# Patient Record
Sex: Male | Born: 1940 | Race: Black or African American | Hispanic: No | Marital: Married | Smoking: Former smoker
Health system: Southern US, Community
[De-identification: ages and names within clinical notes are randomized; demographics above are authoritative.]

## PROBLEM LIST (undated history)

## (undated) DIAGNOSIS — I10 Essential (primary) hypertension: Secondary | ICD-10-CM

## (undated) DIAGNOSIS — K219 Gastro-esophageal reflux disease without esophagitis: Secondary | ICD-10-CM

## (undated) DIAGNOSIS — I351 Nonrheumatic aortic (valve) insufficiency: Secondary | ICD-10-CM

## (undated) DIAGNOSIS — A048 Other specified bacterial intestinal infections: Secondary | ICD-10-CM

## (undated) DIAGNOSIS — Z9889 Other specified postprocedural states: Secondary | ICD-10-CM

## (undated) DIAGNOSIS — H9190 Unspecified hearing loss, unspecified ear: Secondary | ICD-10-CM

## (undated) DIAGNOSIS — I429 Cardiomyopathy, unspecified: Secondary | ICD-10-CM

## (undated) DIAGNOSIS — K5790 Diverticulosis of intestine, part unspecified, without perforation or abscess without bleeding: Secondary | ICD-10-CM

## (undated) DIAGNOSIS — R1310 Dysphagia, unspecified: Secondary | ICD-10-CM

## (undated) DIAGNOSIS — K648 Other hemorrhoids: Secondary | ICD-10-CM

## (undated) DIAGNOSIS — K222 Esophageal obstruction: Secondary | ICD-10-CM

## (undated) DIAGNOSIS — K449 Diaphragmatic hernia without obstruction or gangrene: Secondary | ICD-10-CM

## (undated) DIAGNOSIS — F419 Anxiety disorder, unspecified: Secondary | ICD-10-CM

## (undated) DIAGNOSIS — J45909 Unspecified asthma, uncomplicated: Secondary | ICD-10-CM

## (undated) DIAGNOSIS — K224 Dyskinesia of esophagus: Secondary | ICD-10-CM

## (undated) DIAGNOSIS — I38 Endocarditis, valve unspecified: Secondary | ICD-10-CM

## (undated) HISTORY — DX: Other specified bacterial intestinal infections: A04.8

## (undated) HISTORY — DX: Diverticulosis of intestine, part unspecified, without perforation or abscess without bleeding: K57.90

## (undated) HISTORY — DX: Nonrheumatic aortic (valve) insufficiency: I35.1

## (undated) HISTORY — DX: Esophageal obstruction: K22.2

## (undated) HISTORY — DX: Essential (primary) hypertension: I10

## (undated) HISTORY — PX: UPPER GASTROINTESTINAL ENDOSCOPY: SHX188

## (undated) HISTORY — DX: Endocarditis, valve unspecified: I38

## (undated) HISTORY — DX: Other specified postprocedural states: Z98.890

## (undated) HISTORY — DX: Dyskinesia of esophagus: K22.4

## (undated) HISTORY — DX: Unspecified hearing loss, unspecified ear: H91.90

## (undated) HISTORY — PX: TONSILLECTOMY: SUR1361

## (undated) HISTORY — DX: Dysphagia, unspecified: R13.10

## (undated) HISTORY — DX: Cardiomyopathy, unspecified: I42.9

## (undated) HISTORY — DX: Gastro-esophageal reflux disease without esophagitis: K21.9

## (undated) HISTORY — PX: COLONOSCOPY: SHX174

## (undated) HISTORY — DX: Anxiety disorder, unspecified: F41.9

## (undated) HISTORY — DX: Other hemorrhoids: K64.8

## (undated) HISTORY — DX: Unspecified asthma, uncomplicated: J45.909

## (undated) HISTORY — DX: Diaphragmatic hernia without obstruction or gangrene: K44.9

---

## 2006-11-13 ENCOUNTER — Ambulatory Visit: Payer: Self-pay

## 2006-11-19 ENCOUNTER — Ambulatory Visit: Payer: Self-pay | Admitting: Gastroenterology

## 2006-11-20 ENCOUNTER — Ambulatory Visit: Payer: Self-pay | Admitting: Gastroenterology

## 2006-12-08 ENCOUNTER — Ambulatory Visit: Payer: Self-pay | Admitting: Gastroenterology

## 2006-12-18 ENCOUNTER — Ambulatory Visit: Payer: Self-pay | Admitting: Gastroenterology

## 2008-07-25 ENCOUNTER — Ambulatory Visit: Payer: Self-pay | Admitting: Cardiology

## 2008-08-03 ENCOUNTER — Ambulatory Visit: Payer: Self-pay

## 2008-08-03 ENCOUNTER — Encounter: Payer: Self-pay | Admitting: Cardiology

## 2009-03-23 ENCOUNTER — Ambulatory Visit: Payer: Self-pay | Admitting: Internal Medicine

## 2009-03-23 ENCOUNTER — Ambulatory Visit: Payer: Self-pay | Admitting: Cardiovascular Disease

## 2009-03-23 ENCOUNTER — Observation Stay (HOSPITAL_COMMUNITY): Admission: EM | Admit: 2009-03-23 | Discharge: 2009-03-24 | Payer: Self-pay | Admitting: Emergency Medicine

## 2009-03-24 ENCOUNTER — Encounter (INDEPENDENT_AMBULATORY_CARE_PROVIDER_SITE_OTHER): Payer: Self-pay | Admitting: *Deleted

## 2009-03-24 ENCOUNTER — Encounter: Payer: Self-pay | Admitting: Internal Medicine

## 2009-03-28 ENCOUNTER — Ambulatory Visit (HOSPITAL_COMMUNITY): Admission: RE | Admit: 2009-03-28 | Discharge: 2009-03-28 | Payer: Self-pay | Admitting: Internal Medicine

## 2009-03-28 ENCOUNTER — Encounter (INDEPENDENT_AMBULATORY_CARE_PROVIDER_SITE_OTHER): Payer: Self-pay | Admitting: *Deleted

## 2009-03-29 ENCOUNTER — Encounter: Payer: Self-pay | Admitting: Gastroenterology

## 2009-03-30 ENCOUNTER — Telehealth: Payer: Self-pay | Admitting: Internal Medicine

## 2009-04-18 ENCOUNTER — Ambulatory Visit: Payer: Self-pay | Admitting: Family Medicine

## 2009-04-18 ENCOUNTER — Telehealth: Payer: Self-pay | Admitting: Gastroenterology

## 2009-04-18 ENCOUNTER — Emergency Department (HOSPITAL_COMMUNITY): Admission: EM | Admit: 2009-04-18 | Discharge: 2009-04-18 | Payer: Self-pay | Admitting: Emergency Medicine

## 2009-04-18 ENCOUNTER — Encounter (INDEPENDENT_AMBULATORY_CARE_PROVIDER_SITE_OTHER): Payer: Self-pay | Admitting: *Deleted

## 2009-04-19 DIAGNOSIS — Z8719 Personal history of other diseases of the digestive system: Secondary | ICD-10-CM

## 2009-04-19 DIAGNOSIS — I10 Essential (primary) hypertension: Secondary | ICD-10-CM | POA: Insufficient documentation

## 2009-04-20 ENCOUNTER — Ambulatory Visit: Payer: Self-pay | Admitting: Cardiology

## 2009-04-20 DIAGNOSIS — I428 Other cardiomyopathies: Secondary | ICD-10-CM

## 2009-04-27 ENCOUNTER — Telehealth (INDEPENDENT_AMBULATORY_CARE_PROVIDER_SITE_OTHER): Payer: Self-pay | Admitting: *Deleted

## 2009-04-27 ENCOUNTER — Encounter: Payer: Self-pay | Admitting: *Deleted

## 2009-05-01 ENCOUNTER — Encounter: Payer: Self-pay | Admitting: Cardiology

## 2009-05-01 ENCOUNTER — Ambulatory Visit: Payer: Self-pay

## 2009-05-02 ENCOUNTER — Ambulatory Visit (HOSPITAL_COMMUNITY): Admission: RE | Admit: 2009-05-02 | Discharge: 2009-05-02 | Payer: Self-pay | Admitting: Gastroenterology

## 2009-05-02 ENCOUNTER — Ambulatory Visit: Payer: Self-pay | Admitting: Gastroenterology

## 2009-05-12 ENCOUNTER — Ambulatory Visit: Payer: Self-pay | Admitting: Gastroenterology

## 2009-05-26 ENCOUNTER — Encounter (INDEPENDENT_AMBULATORY_CARE_PROVIDER_SITE_OTHER): Payer: Self-pay | Admitting: *Deleted

## 2009-06-20 ENCOUNTER — Ambulatory Visit: Payer: Self-pay | Admitting: Gastroenterology

## 2009-08-08 ENCOUNTER — Encounter: Payer: Self-pay | Admitting: Cardiology

## 2009-08-09 ENCOUNTER — Ambulatory Visit: Payer: Self-pay | Admitting: Cardiology

## 2009-11-07 IMAGING — CR DG CHEST 1V PORT
1 series · 1 of 1 positions shown · non-contrast
Comparison: 03/24/2019.

CLINICAL DATA: Short of breath and chest pain.

PORTABLE CHEST - 1 VIEW

[AP]
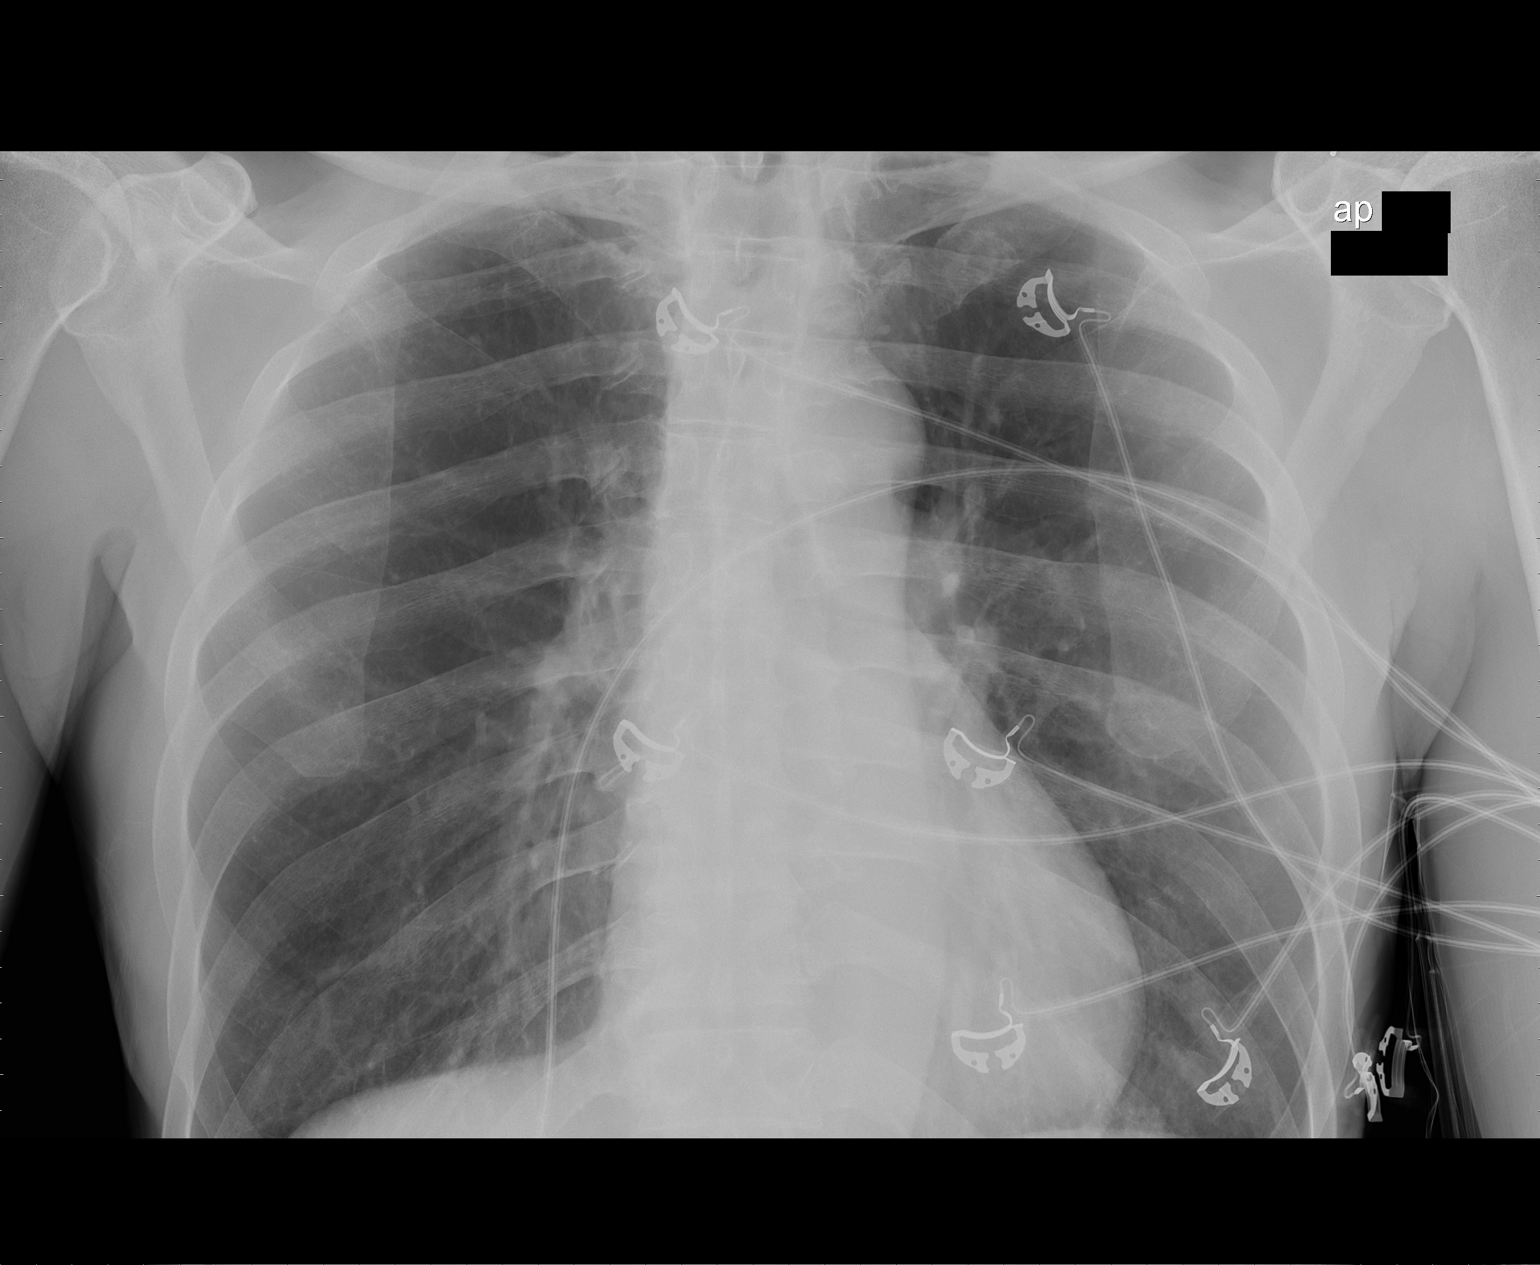

[1 of 1 positions shown; findings below may reference images not displayed]

FINDINGS: Heart size is normal and there is no heart failure.  The
lungs are clear without infiltrate or edema.  There is no pleural
effusion.  There is mild apical scarring bilaterally.
IMPRESSION: No active cardiopulmonary disease.

## 2010-01-22 ENCOUNTER — Emergency Department (HOSPITAL_COMMUNITY): Admission: EM | Admit: 2010-01-22 | Discharge: 2010-01-22 | Payer: Self-pay | Admitting: Emergency Medicine

## 2010-04-16 ENCOUNTER — Encounter: Admission: RE | Admit: 2010-04-16 | Discharge: 2010-04-16 | Payer: Self-pay | Admitting: Internal Medicine

## 2010-06-22 ENCOUNTER — Encounter (INDEPENDENT_AMBULATORY_CARE_PROVIDER_SITE_OTHER): Payer: Self-pay | Admitting: Diagnostic Neuroimaging

## 2010-06-22 ENCOUNTER — Ambulatory Visit (HOSPITAL_COMMUNITY): Admission: RE | Admit: 2010-06-22 | Discharge: 2010-06-22 | Payer: Self-pay | Admitting: Diagnostic Neuroimaging

## 2010-06-22 ENCOUNTER — Ambulatory Visit: Payer: Self-pay | Admitting: Internal Medicine

## 2010-06-22 ENCOUNTER — Ambulatory Visit: Payer: Self-pay

## 2010-07-10 ENCOUNTER — Encounter: Admission: RE | Admit: 2010-07-10 | Discharge: 2010-07-11 | Payer: Self-pay | Admitting: Diagnostic Neuroimaging

## 2010-07-16 ENCOUNTER — Telehealth: Payer: Self-pay | Admitting: Cardiology

## 2010-10-23 NOTE — Progress Notes (Signed)
Summary: echo on 9/30 -  call MD who ordered echo  Phone Note Call from Patient Call back at Home Phone [phone]   Caller: DaughterLattie Corns w [phone] Reason for Call: Talk to Nurse Details for Reason: echo on 9/30. Initial call taken by: Lorne Skeens,  July 16, 2010 1:08 PM  Follow-up for Phone Call        attempted to call the call back number left and there is no answer.  test results were faxed to the ordering MD and they should call there for results.  Sander Nephew, RN Dr Marjory Lies called back 4:42 pm - Para March gone for the day - will be in tomorrow until about 5:30 pm  Daughter aware to call other MD's office Follow-up by: Charolotte Capuchin, RN,  July 17, 2010 4:44 PM

## 2010-11-07 ENCOUNTER — Telehealth: Payer: Self-pay | Admitting: Gastroenterology

## 2010-11-14 NOTE — Progress Notes (Signed)
Summary: refill  Phone Note Call from Patient Call back at Home Phone [phone]   Caller: daughter  Call For: Dr Arlyce Dice Reason for Call: Talk to Nurse Summary of Call: Patient needs refills for DILTIAZEM until his appt 3-23 Initial call taken by: Tawni Levy,  November 07, 2010 4:52 PM  Follow-up for Phone Call        Prescription printed and faxed to pts pharmacy Follow-up by: [name] CMA Duncan Dull),  November 07, 2010 4:55 PM  Additional Follow-up for Phone Call Additional follow up Details #1::        Called pt to inform med faxed..Not correct number on file Additional Follow-up by: [name] CMA Duncan Dull),  November 07, 2010 4:56 PM    Prescriptions: DILTIAZEM 24HR ER 180MG  Take 1 tablet by mouth once a day  #30 x 1   Entered by:   [name] CMA (AAMA)   Authorized by:   Louis Meckel MD   Signed by:   [name] CMA (AAMA) on 11/07/2010   Method used:   Printed then faxed to ...       [facility] [address] [phone]* (retail)       [address]       [place], [place]  [address]       Ph: [phone] or [phone]       Fax: [phone]   RxID:   [id number]

## 2010-12-11 LAB — CBC
Hemoglobin: 11.6 g/dL — ABNORMAL LOW (ref 13.0–17.0)
MCHC: 32.9 g/dL (ref 30.0–36.0)
Platelets: 181 10*3/uL (ref 150–400)
RBC: 3.7 MIL/uL — ABNORMAL LOW (ref 4.22–5.81)
RDW: 13.6 % (ref 11.5–15.5)
WBC: 5.8 10*3/uL (ref 4.0–10.5)

## 2010-12-11 LAB — POCT I-STAT, CHEM 8
Calcium, Ion: 1.09 mmol/L — ABNORMAL LOW (ref 1.12–1.32)
Chloride: 103 mEq/L (ref 96–112)
Glucose, Bld: 97 mg/dL (ref 70–99)
Sodium: 140 mEq/L (ref 135–145)
TCO2: 27 mmol/L (ref 0–100)

## 2010-12-11 LAB — DIFFERENTIAL
Basophils Relative: 0 % (ref 0–1)
Eosinophils Relative: 1 % (ref 0–5)
Lymphocytes Relative: 27 % (ref 12–46)
Monocytes Absolute: 0.5 10*3/uL (ref 0.1–1.0)
Neutro Abs: 3.6 10*3/uL (ref 1.7–7.7)

## 2010-12-14 ENCOUNTER — Encounter: Payer: Self-pay | Admitting: Gastroenterology

## 2010-12-14 ENCOUNTER — Ambulatory Visit (INDEPENDENT_AMBULATORY_CARE_PROVIDER_SITE_OTHER): Payer: BC Managed Care – PPO | Admitting: Gastroenterology

## 2010-12-14 DIAGNOSIS — K224 Dyskinesia of esophagus: Secondary | ICD-10-CM

## 2010-12-14 DIAGNOSIS — R1310 Dysphagia, unspecified: Secondary | ICD-10-CM

## 2010-12-14 NOTE — Assessment & Plan Note (Addendum)
He is symptoms -free on Protonix. Plan to continue with the same.

## 2010-12-14 NOTE — Patient Instructions (Signed)
We are renewing your medications today

## 2010-12-14 NOTE — Progress Notes (Signed)
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 He has esophageal spasm, diagnosed by  Esophageal manometry. In the past, a distal esophageal stricture was dilated.   Last endoscopy in 2010  was normal. He is generally symptom-free on maintenance Protonix 40 mg daily. He no longer has chest pain. He has occasional, mild dysphagia to solids.    Review of Systems: Pertinent positive and negative review of systems were noted in the above HPI section. All other review of systems were otherwise negative.      Current Medications, Allergies, Past Medical History, Past Surgical History, Family History and Social History were reviewed in [employer] record . Vital signs were reviewed in today's medical record Physical Exam: General: Well developed , well nourished, no acute distress Head: Normocephalic and atraumatic Eyes:  sclerae anicteric, EOMI Ears: Normal auditory acuity Mouth: No deformity or lesions Neck: Supple, no masses or thyromegaly Lungs: Clear throughout to auscultation Heart: Regular rate and rhythm; no murmurs, rubs or bruits Abdomen: Soft, non tender and non distended. No masses, hepatosplenomegaly or hernias noted. Normal Bowel sounds Rectal:deferred Musculoskeletal: Symmetrical with no gross deformities  Skin: No lesions on visible extremities Pulses:  Normal pulses noted Extremities: No clubbing, cyanosis, edema or deformities noted Neurological: Alert oriented x 4, grossly nonfocal Cervical Nodes:  No significant cervical adenopathy Inguinal Nodes: No significant inguinal adenopathy Psychological:  Alert and cooperative. Normal mood and affect

## 2010-12-14 NOTE — Assessment & Plan Note (Signed)
He has mild dysphagia, which is most likely due to an early esophageal stricture. I carefully instructed the patient to contact me if dysphagia worsens at which point I would repeat endoscopy with dilatation.

## 2010-12-30 LAB — BASIC METABOLIC PANEL
BUN: 16 mg/dL (ref 6–23)
CO2: 27 mEq/L (ref 19–32)
CO2: 30 mEq/L (ref 19–32)
Creatinine, Ser: 1.26 mg/dL (ref 0.4–1.5)
GFR calc Af Amer: >60 mL/min (ref >60)
GFR calc non Af Amer: 54 mL/min — ABNORMAL LOW (ref >60)
GFR calc non Af Amer: 57 mL/min — ABNORMAL LOW (ref >60)
Glucose, Bld: 110 mg/dL — ABNORMAL HIGH (ref 70–99)
Potassium: 3.3 mEq/L — ABNORMAL LOW (ref 3.5–5.1)
Potassium: 3.6 mEq/L (ref 3.5–5.1)
Sodium: 138 mEq/L (ref 135–145)

## 2010-12-30 LAB — DIFFERENTIAL
Basophils Relative: 0 % (ref 0–1)
Eosinophils Absolute: 0.1 10*3/uL (ref 0.0–0.7)
Eosinophils Relative: 1 % (ref 0–5)
Lymphs Abs: 2.4 10*3/uL (ref 0.7–4.0)
Monocytes Absolute: 0.6 10*3/uL (ref 0.1–1.0)
Neutrophils Relative %: 56 % (ref 43–77)

## 2010-12-30 LAB — POCT CARDIAC MARKERS
CKMB, poc: <1 ng/mL — ABNORMAL LOW (ref 1.0–8.0)
Myoglobin, poc: 124 ng/mL (ref 12–200)
Troponin i, poc: <0.05 ng/mL (ref 0.00–0.09)

## 2010-12-30 LAB — CBC
HCT: 33.4 % — ABNORMAL LOW (ref 39.0–52.0)
HCT: 35.1 % — ABNORMAL LOW (ref 39.0–52.0)
Hemoglobin: 11.4 g/dL — ABNORMAL LOW (ref 13.0–17.0)
Hemoglobin: 11.8 g/dL — ABNORMAL LOW (ref 13.0–17.0)
Hemoglobin: 12.8 g/dL — ABNORMAL LOW (ref 13.0–17.0)
MCHC: 33.6 g/dL (ref 30.0–36.0)
MCHC: 34 g/dL (ref 30.0–36.0)
MCV: 95.4 fL (ref 78.0–100.0)
MCV: 95.5 fL (ref 78.0–100.0)
Platelets: 163 10*3/uL (ref 150–400)
Platelets: 189 10*3/uL (ref 150–400)
RDW: 12.9 % (ref 11.5–15.5)

## 2010-12-30 LAB — COMPREHENSIVE METABOLIC PANEL
Albumin: 3.1 g/dL — ABNORMAL LOW (ref 3.5–5.2)
Alkaline Phosphatase: 53 U/L (ref 39–117)
BUN: 13 mg/dL (ref 6–23)
Calcium: 8.5 mg/dL (ref 8.4–10.5)
Glucose, Bld: 86 mg/dL (ref 70–99)
Potassium: 3.9 mEq/L (ref 3.5–5.1)
Sodium: 138 mEq/L (ref 135–145)
Total Protein: 6.1 g/dL (ref 6.0–8.3)

## 2010-12-30 LAB — CK TOTAL AND CKMB (NOT AT ARMC)
CK, MB: 0.9 ng/mL (ref 0.3–4.0)
Relative Index: 0.7 (ref 0.0–2.5)
Total CK: 129 U/L (ref 7–232)
Total CK: 142 U/L (ref 7–232)

## 2010-12-30 LAB — CARDIAC PANEL(CRET KIN+CKTOT+MB+TROPI)
CK, MB: 0.8 ng/mL (ref 0.3–4.0)
CK, MB: 2.3 ng/mL (ref 0.3–4.0)
Relative Index: 0.8 (ref 0.0–2.5)
Relative Index: 2 (ref 0.0–2.5)
Troponin I: <0.01 ng/mL (ref 0.00–0.06)

## 2011-01-05 ENCOUNTER — Other Ambulatory Visit: Payer: Self-pay | Admitting: Gastroenterology

## 2011-01-08 ENCOUNTER — Other Ambulatory Visit: Payer: Self-pay | Admitting: Gastroenterology

## 2011-01-08 NOTE — Telephone Encounter (Signed)
Medication Sent already from refill request from pharmacy

## 2011-02-05 NOTE — H&P (Signed)
NAME:  Bradley Ramirez, Bradley Ramirez NO.:  [id number]   MEDICAL RECORD NO.:  [id number]          PATIENT TYPE:  INP   LOCATION:  4705                         FACILITY:  [facility]   PHYSICIAN:  Verne Carrow, MDDATE OF BIRTH:  08-18-1941   DATE OF ADMISSION:  03/23/2009  DATE OF DISCHARGE:                              HISTORY & PHYSICAL   PRIMARY CARDIOLOGIST:  Rollene Rotunda, MD, [facility]   PRIMARY CARE PHYSICIAN:  Jonita Albee, MD   Mr. Ploeger is a delightful 70 year old African American gentleman,  who is deaf.  He is also married to a woman that is deaf, and they have  4 children, who all have no hearing problems.  He is here today with an  interpreter accompanied by his family.  He was in his usual state of  health yesterday and no problems.  After eating lunch, he began having  problems with some indigestion, GERD symptoms, lot of belching, later  turned in some epigastric discomfort.  He tried Tums and Mylanta without  relief.  This continued intermittently through the day, went to bed last  night, woke up around 3 a.m. because of discomfort in his chest.  Further evaluation reveals epigastric discomfort.  He is very sore and  tender to touch there now.  He states he felt like he could not swallow  very well.  He drank some water and this did not help, tried some  Mylanta again, still no relief, finally fell back asleep, symptoms  returned again this morning.  His family decided he need to get  evaluated.  He has not at any time had any heaviness, tightness, or  pressure in his chest per se, all the discomfort has been high  epigastric area.  He denies any associated symptoms.  He has been dizzy  at times, but he is associated this with a choking sensation when he was  eating dinner last night.  He says he feels like something is stuck in  his throat.   PAST MEDICAL HISTORY:  Hypertension, GERD, and hiatal hernia.  He has  been evaluated by Dr. Arlyce Dice in the past.   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  Antoine Poche.  He underwent a 2-D echocardiogram in November 2009.  The  patient found to have mild aortic valve regurgitation and trivial mitral  valve regurgitation, an EF of 45%, pulmonic valve was not well  visualized.  There was mild pulmonic regurgitation and trivial tricuspid  regurgitation.  Dr. Gala Romney who interpreted echocardiogram felt there  was at least mild PI.   SOCIAL HISTORY:  The patient lives in [place] with his wife.  He is a  retired Arboriculturist, although he continues to remain very active and well  in his own home, etc., doing work around [employer].  He quit smoking in  1998.  He quit drinking in 1987.  He has 4 adult children alive and  well.   FAMILY HISTORY:  Noncontributory for coronary artery disease.   REVIEW OF SYSTEMS:  Positive for 10-pound weight loss  in 6 months.  This  correlates with the time that Dr. Antoine Poche started the patient on HCTZ  for blood pressure, some dizziness associated with difficulty swallowing  and choking sensation and GERD symptoms, and epigastric pain.  All other  systems reviewed and negative.   ALLERGIES:  No known drug allergies.   MEDICATIONS:  1. Protonix.  2. HCTZ 12.5.  3. Aspirin 81.  4. Vitamin E.  5. Multivitamin daily.   PHYSICAL EXAMINATION:  VITAL SIGNS:  Temperature 97.5, heart rate 60,  respirations 20, blood pressure 138/80, and sat 98% on room air.  GENERAL:  In no acute distress.  HEENT:  Remarkable.  NECK:  Supple without lymphadenopathy, bruit, or JVD.  CARDIOVASCULAR:  S1 and S2 with a questionable 1/6 diastolic murmur.  LUNGS:  Clear to auscultation bilaterally.  SKIN:  Warm and dry.  ABDOMEN:  Soft and nontender.  He does have tenderness in the high  epigastric area.  However, he has positive bowel sounds.  LOWER EXTREMITIES:  Without clubbing, cyanosis, or edema.  NEUROLOGIC:  Alert and oriented x3 with  the exception of being deaf.   Chest x-ray showed no acute findings.  EKG; sinus rhythm with PVCs at a  rate of 58.  Point of care negative x1.  Hematocrit 37.2.  Potassium is  3.3, creatinine 1.3, and glucose 110.   The patient will be admitted for observation, doubt chest pain is a  cardiac.  The patient has received a GI cocktail with complete  resolution of his discomfort here in the ER.  We will admit for  observation, cycle enzymes.  I have already called Dr. Marzetta Board PA to  consult.  We will continue home meds for now.  Check TSH and hemoglobin  A1c.  Follow up outpatient with Dr. Antoine Poche.  Dr. Verne Carrow  has been into examine and assess the patient, agrees with plan of care.      Dorian Pod, ACNP      Verne Carrow, MD  Electronically Signed    MB/MEDQ  D:  03/23/2009  T:  03/24/2009  Job:  [id number]

## 2011-02-05 NOTE — Assessment & Plan Note (Signed)
[facility]                            CARDIOLOGY OFFICE NOTE   ROBEY, MASSMANN                     MRN:          [id number]  DATE:07/25/2008                            DOB:          [birth year]    REASON FOR CONSULTATION:  Evaluate the patient with murmur.   HISTORY OF PRESENT ILLNESS:  The patient is a very pleasant 70 year old  gentleman with diastolic heart murmur noted by Dr. Perrin Maltese recently.  The  patient is being followed by his primary care and also for an elevated  PSA.  He had not been noted apparently to have this heart murmur in the  past.  He was referred to [place] in February of last year for stress  perfusion study which was negative for any evidence of infarct or  ischemia.   The patient is a remarkable gentleman.  He is been deaf since age 29  secondary to ear infections.  His wife also is deaf.  They have raised 4  hearing children.  He has several grandchildren.  He works.  He comes  today with his interpreter.  He says he feels well.  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  With this level of activity, he says  he does not get any chest pressure, neck, or arm discomfort.  He does  not have any shortness of breath, PND, or orthopnea.  He does not have  any palpitations, presyncope, or syncope.   Of note, the patient has recently been found to have some elevated blood  pressures and this is being followed by Dr. Perrin Maltese.   PAST MEDICAL HISTORY:  Elevated PSA, recently elevated blood pressures.   PAST SURGICAL HISTORY:  None.   ALLERGIES:  None.   MEDICATIONS:  Pantoprazole 40 mg daily.   SOCIAL HISTORY:  The patient smoked 2 packs per day for 35 years, but  quit in 1998.  He quit drinking alcohol in 1987.  He still works part-  time, though he is retired.  He has 4 children and many grandchildren.  He is married.   FAMILY HISTORY:  Negative for early coronary artery disease, negative  for cardiomyopathies,  or other vascular disease.   REVIEW OF SYSTEMS:  As stated in the HPI, positive for rare dizziness,  dentures, occasional reflux, varicose veins.  Negative for all other  systems.   PHYSICAL EXAMINATION:  GENERAL:  The patient is pleasant and in no  distress.  VITAL SIGNS:  Blood pressure 150/88, heart rate 74 and regular, weight  170 pounds.  HEENT:  Eyelids unremarkable, pupils equal, round, and reactive to  light, fundi, not visualized, oral mucosa unremarkable.  NECK:  No jugular venous distention at 45 degrees, carotid upstroke  brisk and symmetric, no bruits, no thyromegaly.  LYMPHATICS:  No cervical, axillary, inguinal adenopathy.  LUNGS:  Clear to auscultation bilaterally.  BACK:  No costovertebral angle tenderness.  CHEST:  Unremarkable.  HEART:  PMI not displaced or sustained, S1 and S2 within normal limits,  no S3, no S4, 2/6 diastolic heart murmur, heard best at the third to  fourth intercostal space, no systolic murmurs.  ABDOMEN:  Flat, positive bowel sounds, normal in frequency and pitch, no  bruits, no rebound, no guarding, no midline pulsatile mass, no  hepatomegaly, splenomegaly.  SKIN:  No rashes, no nodules.  EXTREMITIES:  2+ pulses throughout, no edema, no cyanosis, no clubbing.  NEURO:  Oriented to person, place, and time, cranial nerves II through  XII grossly intact except for his deafness, motor grossly intact.   EKG sinus rhythm, rate 71, premature atrial contractions, no acute ST-T  wave changes.   ASSESSMENT AND PLAN:  1. Diastolic heart murmur.  The patient has a soft diastolic heart      murmur.  At this point he may have some mild aortic insufficiency      judging from the location.  I would be surprised if this is      hemodynamically significant, but it is a clear murmur and needs to      be followed.  Therefore, we will get an echocardiogram.  Further      evaluation based on the results of this.  2. Hypertension.  We discussed this.  His blood  pressures have      fluctuated little bit recently, been elevated.  I suspect he is      going to need something such as low dose hydrochlorothiazide.      Certainly, he is think, does not drink alcohol, and is active.      Therefore, therapeutic lifestyle changes are not likely to      contribute to better blood pressure control.  I asked him to watch      his blood pressure if he has the opportunity and he will follow      with Dr. Perrin Maltese in the next couple of months.  3. Followup.  I have scheduled the patient to see me in 1 year, but      will change this if I need to see him sooner based on the      echocardiogram.     Rollene Rotunda, MD, [facility]  Electronically Signed    JH/MedQ  DD: 07/25/2008  DT: 07/26/2008  Job #: [id number]   cc:   Jonita Albee, M.D.

## 2011-02-05 NOTE — Op Note (Signed)
NAME:  Bradley Ramirez, Bradley Ramirez            ACCOUNT NO.:  [id number]   MEDICAL RECORD NO.:  [id number]          PATIENT TYPE:  AMB   LOCATION:  ENDO                         FACILITY[facility]  [facility]   PHYSICIAN:  [name], MD,FACGDATE OF BIRTH:  11/02/1940   DATE OF PROCEDURE:  DATE OF DISCHARGE:  05/02/2009                               OPERATIVE REPORT   Esophageal manometry was performed in the usual pullback technique.   Upper esophageal sphincter pressure contractions and relaxation were  normal.  There was 100% peristaltic contraction.  They were of normal amplitude  and duration.  Resting LES pressure was 24.4 mmHg.  Residual pressure was 5.4 mmHg  which is normal.  Percent relaxation was 79%.   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   RECOMMENDATIONS:  Trial of antispasmodics such as calcium blocking  agents or Botox injection of the LES.      [name], MD,FACG  Electronically Signed     RDK/MEDQ  D:  05/12/2009  T:  05/13/2009  Job:  [id number]

## 2011-02-05 NOTE — Discharge Summary (Signed)
NAME:  Bradley Ramirez, Bradley Ramirez NO.:  [id number]   MEDICAL RECORD NO.:  [id number]          PATIENT TYPE:  OBV   LOCATION:  4705                         FACILITY:  [facility]   PHYSICIAN:  Rollene Rotunda, MD, FACCDATE OF BIRTH:  March 08, 1941   DATE OF ADMISSION:  03/23/2009  DATE OF DISCHARGE:  03/24/2009                               DISCHARGE SUMMARY   PRIMARY CARDIOLOGIST:  Rollene Rotunda, MD, [facility].   PRIMARY CARE PHYSICIAN:  Jonita Albee, MD.   CONSULTING PHYSICIAN:  Hedwig Morton. Juanda Chance, MD.   PROCEDURES PERFORMED DURING HOSPITALIZATION:  EGD, which was completed  on March 24, 2009, revealing normal GE junction otherwise normal  examination.  No evidence of stricture.  Status post passage of large 17-  mm Savary dilator without resistance or blood on dilator.   FINAL DISCHARGE DIAGNOSES:  1. Noncardiac chest pain.  2. Hypertension.  3. Gastroesophageal reflux disease  4. History of hiatal hernia.  5. History of deafness.  6. Diastolic murmur   HOSPITAL COURSE:  This is a 70 year old deaf African American male who  presented to the emergency room with complaints of indigestion, GERD  symptoms, a lot of belching, but later turned into epigastric  discomfort.  Tums and Mylanta did not give relief.  The patient was seen  in the emergency room secondary to recurrence of symptoms along with  heaviness and tightness in his chest and through interpreter, the  patient was explaining symptoms without evidence of diaphoresis, nausea,  vomiting, or presyncope.   The patient was seen and examined by Dr. Verne Carrow, and  Dorian Pod, nurse practitioner in the emergency room and admitted  to rule out myocardial infarction.  The patient was given a GI cocktail  with complete resolution of his discomfort in the emergency room.  The  patient's TSH and hemoglobin A1c were checked.  The patient's cardiac  enzymes were found to be negative x3.  The patient's hemoglobin A1c  was  within normal limits at 5.9.  All other labs were found to be negative.  The patient did undergo EGD on March 24, 2009, by Dr. Lina Sar with  results as discussed above.  Please see Dr. Verlee Monte Brodie's note for more  details.  It was found that the patient was placed on Protonix and will  follow up with Dr. Juanda Chance in her office for a barium swallow as an  outpatient.  The patient will also follow up with Dr. Rollene Rotunda in  the office in approximately 3 months for continued cardiac management.  The patient's medications will not be changed with the exception of  addition of Protonix.  The patient through interpreter verbalized  understanding and will be discharged today.   DISCHARGE LABORATORY DATA:  Hemoglobin 11.4, hematocrit 33.4, white  blood cells 6.7, platelets 163.  Sodium 138, potassium 3.9, chloride  107, CO2 29, BUN 13, creatinine 1.32.  LFTs within normal limits.  Cardiac enzymes negative x3, A1c 5.9.   VITAL SIGNS ON DISCHARGE:  Blood pressure 96/65, heart rate 56,  respirations 18, temperature 98.2.   DISCHARGE MEDICATIONS:  Aspirin 81 mg daily, Protonix  40 mg twice a day,  hydrochlorothiazide 12.5 mg daily, multivitamin 1 daily.   EKG on discharge revealing sinus bradycardia with a rate of 55 beats per  minute with no evidence of heart block.   FOLLOWUP PLANS AND APPOINTMENT:  1. The patient will be seen and Dr. Jenene Slicker office in approximately      1 month to 6 weeks.  2. The patient will follow with Dr. Lina Sar.  He is to call on his      own in accord to make that appointment.  Plans were to follow up      with a barium swallow.  3. Secondary to bradycardia on a patient with no evidence of TSH      elevation or use of beta-blocker should be evaluated with possible      Holter as an outpatient if he has continued symptoms to evaluate      for symptomatic bradycardia.   Time spent with the patient to include physician time 35 minutes.      Bettey Mare. Lyman Bishop, NP      Rollene Rotunda, MD, [facility]  Electronically Signed    KML/MEDQ  D:  03/24/2009  T:  03/25/2009  Job:  [id number]   cc:   [name], MD,FACG  Jonita Albee, M.D.

## 2011-02-08 NOTE — Assessment & Plan Note (Signed)
[facility]                         GASTROENTEROLOGY OFFICE NOTE   Bradley, Ramirez                     MRN:          [id number]  DATE:11/19/2006                            DOB:          [birth year]    REASON FOR CONSULTATION:  Dysphagia.   Bradley Ramirez is a 70 year old African-American male, referred through  the courtesy of Dr. Perrin Maltese for evaluation.  He has been experiencing  dysphagia to solids and occasional liquids.  He has occasional pyrosis  for which he takes Protonix.  He denies odynophagia.   PAST MEDICAL HISTORY:  Pertinent for deafness.  He is status post  tonsillectomy.   FAMILY HISTORY:  Noncontributory.   MEDICATIONS:  Baby aspirin.   He has no allergies.   He quit smoking 8 years ago.  He does not drink.  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   REVIEW OF SYSTEMS:  Positive for joint pains.   PHYSICAL EXAMINATION:  Pulse 82, blood pressure 120/70, weight 172.  HEENT: EOMI. PERRLA. Sclerae are anicteric.  Conjunctivae are pink.  NECK:  Supple without thyromegaly, adenopathy or carotid bruits.  CHEST:  Clear to auscultation and percussion without adventitious  sounds.  CARDIAC:  Regular rhythm; normal S1 S2.  There are no murmurs, gallops  or rubs.  ABDOMEN:  Bowel sounds are normoactive.  Abdomen is soft, non-tender and  non-distended.  There are no abdominal masses, tenderness, splenic  enlargement or hepatomegaly.  EXTREMITIES:  Full range of motion.  No cyanosis, clubbing or edema.  RECTAL:  Deferred.   IMPRESSION:  1. Dysphagia, rule out esophageal stricture.  2. Deafness.   RECOMMENDATION:  1. Upper endoscopy with dilatation as indicated.  2. Screening colonoscopy (to be done at some point in the future).     [name], MD,FACG  Electronically Signed    RDK/MedQ  DD: 11/19/2006  DT: 11/19/2006  Job #: [id number]   cc:   Jonita Albee, M.D.

## 2011-02-08 NOTE — Letter (Signed)
November 19, 2006    Jonita Albee, M.D.  [facility]  [address]  [place], [place] [address]   RE:  [name], [name]  MRN:  [id number]  /  DOB:  1941/08/03   Dear Dr. Perrin Maltese:   Upon your kind referral, I had the pleasure of evaluating your patient  and I am pleased to offer my findings.  I saw Bradley Ramirez in the  office today.  Enclosed is a copy of my progress note that details my  findings and recommendations.   Thank you for the opportunity to participate in your patient's care.    Sincerely,      [name], MD,FACG  Electronically Signed    RDK/MedQ  DD: 11/19/2006  DT: 11/19/2006  Job #: [id number]

## 2011-03-07 ENCOUNTER — Other Ambulatory Visit: Payer: Self-pay | Admitting: Gastroenterology

## 2011-04-01 ENCOUNTER — Encounter: Payer: Self-pay | Admitting: Cardiology

## 2011-04-24 ENCOUNTER — Encounter: Payer: Self-pay | Admitting: Cardiology

## 2011-05-08 ENCOUNTER — Other Ambulatory Visit: Payer: Self-pay | Admitting: Gastroenterology

## 2011-05-09 ENCOUNTER — Other Ambulatory Visit: Payer: Self-pay | Admitting: *Deleted

## 2011-05-09 NOTE — Telephone Encounter (Signed)
OK to prescribe 

## 2011-05-09 NOTE — Telephone Encounter (Signed)
Dr Arlyce Dice, We have received a refill request for  Diltiazem 180mg  one a day. Are we still prescribing this for the pt?

## 2011-05-24 ENCOUNTER — Ambulatory Visit (INDEPENDENT_AMBULATORY_CARE_PROVIDER_SITE_OTHER): Payer: BC Managed Care – PPO | Admitting: Cardiology

## 2011-05-24 ENCOUNTER — Encounter: Payer: Self-pay | Admitting: Cardiology

## 2011-05-24 VITALS — BP 128/72 | HR 60 | Ht 74.0 in | Wt 156.1 lb

## 2011-05-24 DIAGNOSIS — R079 Chest pain, unspecified: Secondary | ICD-10-CM

## 2011-05-24 DIAGNOSIS — J984 Other disorders of lung: Secondary | ICD-10-CM

## 2011-05-24 DIAGNOSIS — I428 Other cardiomyopathies: Secondary | ICD-10-CM

## 2011-05-24 DIAGNOSIS — I10 Essential (primary) hypertension: Secondary | ICD-10-CM

## 2011-05-24 DIAGNOSIS — I491 Atrial premature depolarization: Secondary | ICD-10-CM

## 2011-05-24 NOTE — Progress Notes (Signed)
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 However, stress perfusion study suggested a well-preserved ejection fraction and no evidence of ischemia or infarct in the past. He was noted recently to have PACs on EKG. There was a mention of chest discomfort but he does not recall this. He actually reports that he does quite well.  The patient denies any new symptoms such as chest discomfort, neck or arm discomfort. There has been no new shortness of breath, PND or orthopnea. There have been no reported palpitations, presyncope or syncope.  No Known Allergies  Current Outpatient Prescriptions  Medication Sig Dispense Refill  . aspirin 81 MG tablet Take 81 mg by mouth daily.        Marland Kitchen b complex vitamins tablet Take 1 tablet by mouth daily.        . Cholecalciferol (VITAMIN D3) 3000 UNITS TABS Take 1 tablet by mouth daily.        Marland Kitchen diltiazem (CARDIZEM CD) 180 MG 24 hr capsule TAKE 1 CAPSULE EVERY DAY  30 capsule  1  . pantoprazole (PROTONIX) 40 MG tablet Take 40 mg by mouth. Take 1 tablet by mouth 2 times a day       . hydrochlorothiazide (,MICROZIDE/HYDRODIURIL,) 12.5 MG capsule Take 12.5 mg by mouth daily.          Past Medical History  Diagnosis Date  . Hiatal hernia   . GERD (gastroesophageal reflux disease)   . Hypertension   . Esophageal spasm   . Dysphagia, unspecified   . Esophageal stricture   . Chest pain   . Murmur, diastolic   . Deafness   . S/P colonoscopy     Past Surgical History  Procedure Date  . Tonsillectomy     ROS:  As stated in the HPI and negative for all other systems.  PHYSICAL EXAM BP 128/72  Pulse 60  Ht 6\' 2"  (1.88 m)  Wt 156 lb 1.9 oz (70.816 kg)  BMI 20.04 kg/m2 GENERAL:  Well appearing HEENT:  Pupils equal round and reactive, fundi not visualized, oral mucosa unremarkable NECK:  No jugular venous distention, waveform within normal limits, carotid  upstroke brisk and symmetric, no bruits, no thyromegaly LYMPHATICS:  No cervical, inguinal adenopathy LUNGS:  Clear to auscultation bilaterally BACK:  No CVA tenderness CHEST:  Unremarkable HEART:  PMI not displaced or sustained,S1 and S2 within normal limits, no S3, no S4, no clicks, no rubs, soft upper left sternal border diastolic murmur ABD:  Flat, positive bowel sounds normal in frequency in pitch, no bruits, no rebound, no guarding, no midline pulsatile mass, no hepatomegaly, no splenomegaly EXT:  2 plus pulses throughout, no edema, no cyanosis no clubbing SKIN:  No rashes no nodules NEURO:  Cranial nerves II through XII grossly intact, motor grossly intact throughout PSYCH:  Cognitively intact, oriented to person place and time  EKG:  Sinus rhythm, rate 60, axis within normal limits, intervals within normal limits, premature atrial contractions, no acute ST-T wave changes.  ASSESSMENT AND PLAN

## 2011-05-24 NOTE — Assessment & Plan Note (Signed)
He has no symptoms related to this.  No change in therapy is indicated.

## 2011-05-24 NOTE — Assessment & Plan Note (Signed)
The blood pressure is at target. No change in medications is indicated. We will continue with therapeutic lifestyle changes (TLC).  

## 2011-05-24 NOTE — Assessment & Plan Note (Signed)
He denies this and he has had a negative stress test in the recent past.  No further testing is indicated.

## 2011-05-24 NOTE — Assessment & Plan Note (Signed)
He had a mildly reduced ejection fraction on echo last year wiith some pulmonary insufficiency. I will repeat an echocardiogram to followup on this. For now he is asymptomatic.

## 2011-05-24 NOTE — Patient Instructions (Signed)
Your physician has requested that you have an echocardiogram. Echocardiography is a painless test that uses sound waves to create images of your heart. It provides your doctor with information about the size and shape of your heart and how well your heart's chambers and valves are working. This procedure takes approximately one hour. There are no restrictions for this procedure.  The current medical regimen is effective;  continue present plan and medications.  Follow up in 1 year with Dr Hochrein.  You will receive a letter in the mail 2 months before you are due.  Please call us when you receive this letter to schedule your follow up appointment.  

## 2011-06-05 ENCOUNTER — Other Ambulatory Visit (HOSPITAL_COMMUNITY): Payer: Medicare Other | Admitting: Radiology

## 2011-06-09 ENCOUNTER — Other Ambulatory Visit: Payer: Self-pay | Admitting: Gastroenterology

## 2011-06-12 ENCOUNTER — Encounter: Payer: Self-pay | Admitting: *Deleted

## 2011-06-26 ENCOUNTER — Other Ambulatory Visit (HOSPITAL_COMMUNITY): Payer: Medicare Other | Admitting: Radiology

## 2011-06-27 ENCOUNTER — Ambulatory Visit (HOSPITAL_COMMUNITY): Payer: Medicare Other | Attending: Cardiology | Admitting: Radiology

## 2011-06-27 DIAGNOSIS — I10 Essential (primary) hypertension: Secondary | ICD-10-CM | POA: Insufficient documentation

## 2011-06-27 DIAGNOSIS — I428 Other cardiomyopathies: Secondary | ICD-10-CM | POA: Insufficient documentation

## 2011-06-27 DIAGNOSIS — J984 Other disorders of lung: Secondary | ICD-10-CM

## 2011-06-27 DIAGNOSIS — I379 Nonrheumatic pulmonary valve disorder, unspecified: Secondary | ICD-10-CM

## 2011-07-08 ENCOUNTER — Telehealth: Payer: Self-pay | Admitting: Cardiology

## 2011-07-08 NOTE — Telephone Encounter (Signed)
Spoke with pt, aware of echo results Bradley Ramirez  

## 2011-07-08 NOTE — Telephone Encounter (Signed)
Pt is calling tests results please call

## 2011-09-02 ENCOUNTER — Other Ambulatory Visit: Payer: Self-pay | Admitting: Gastroenterology

## 2011-09-02 NOTE — Telephone Encounter (Signed)
Dr Arlyce Dice, Can this patient have a refill of Diltiazem 180mg ?

## 2011-11-05 ENCOUNTER — Other Ambulatory Visit: Payer: Self-pay | Admitting: Gastroenterology

## 2011-12-06 ENCOUNTER — Other Ambulatory Visit: Payer: Self-pay | Admitting: Internal Medicine

## 2012-01-05 ENCOUNTER — Other Ambulatory Visit: Payer: Self-pay | Admitting: Physician Assistant

## 2012-01-05 ENCOUNTER — Other Ambulatory Visit: Payer: Self-pay | Admitting: Gastroenterology

## 2012-01-06 ENCOUNTER — Encounter: Payer: Self-pay | Admitting: Gastroenterology

## 2012-01-06 ENCOUNTER — Ambulatory Visit (INDEPENDENT_AMBULATORY_CARE_PROVIDER_SITE_OTHER): Payer: Medicare Other | Admitting: Gastroenterology

## 2012-01-06 VITALS — BP 116/74 | HR 64 | Ht 74.0 in | Wt 162.8 lb

## 2012-01-06 DIAGNOSIS — R112 Nausea with vomiting, unspecified: Secondary | ICD-10-CM

## 2012-01-06 NOTE — Assessment & Plan Note (Addendum)
Symptoms of postprandial nausea and vomiting suggest a partial gastric outlet obstruction or gastroparesis.  Last endoscopy in 2010 was normal.  Recommendations #1 gastric emptying scan #2 to consider changing PPI medicine pending results of #1

## 2012-01-06 NOTE — Progress Notes (Signed)
History of Present Illness:  Bradley Ramirez has returned for evaluation of nausea and vomiting. 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  His main complaint is postprandial vomiting. This may occur one to 3 hours after eating, especially in the mornings. He is without abdominal pain.  He takes Protonix daily. He complains of postprandial fullness. At this time he denies chest pain or dysphagia.    Review of Systems: Pertinent positive and negative review of systems were noted in the above HPI section. All other review of systems were otherwise negative.    Current Medications, Allergies, Past Medical History, Past Surgical History, Family History and Social History were reviewed in [employer] electronic medical record  Vital signs were reviewed in today's medical record. Physical Exam: General: Well developed , well nourished, no acute distress Head: Normocephalic and atraumatic Eyes:  sclerae anicteric, EOMI Ears: Normal auditory acuity Mouth: No deformity or lesions Lungs: Clear throughout to auscultation Heart: Regular rate and rhythm; no murmurs, rubs or bruits Abdomen: Soft, non tender and non distended. No masses, hepatosplenomegaly or hernias noted. Normal Bowel sounds. There is no succussion splash Rectal:deferred Musculoskeletal: Symmetrical with no gross deformities  Pulses:  Normal pulses noted Extremities: No clubbing, cyanosis, edema or deformities noted Neurological: Alert oriented x 4, grossly nonfocal Psychological:  Alert and cooperative. Normal mood and affect

## 2012-01-06 NOTE — Patient Instructions (Signed)
You have been scheduled for a Gastric Emptying Scan at [facility]iology on the 1st floor  Nothing to eat or drink after midnight and no stomach medications 24 hours before procedure  01/16/2012 at 10:00am to arrive at 9:45 am

## 2012-01-06 NOTE — Telephone Encounter (Signed)
Can this patient have a refill of his diltazem 180 pill

## 2012-01-16 ENCOUNTER — Ambulatory Visit (HOSPITAL_COMMUNITY)
Admission: RE | Admit: 2012-01-16 | Discharge: 2012-01-16 | Disposition: A | Discharge status: Home / Self Care | Payer: Medicare Other | Source: Ambulatory Visit | Attending: Gastroenterology | Admitting: Gastroenterology

## 2012-01-16 DIAGNOSIS — R142 Eructation: Secondary | ICD-10-CM | POA: Insufficient documentation

## 2012-01-16 DIAGNOSIS — R143 Flatulence: Secondary | ICD-10-CM | POA: Insufficient documentation

## 2012-01-16 DIAGNOSIS — R112 Nausea with vomiting, unspecified: Secondary | ICD-10-CM | POA: Insufficient documentation

## 2012-01-16 DIAGNOSIS — R141 Gas pain: Secondary | ICD-10-CM | POA: Insufficient documentation

## 2012-01-16 MED ORDER — TECHNETIUM TC 99M SULFUR COLLOID
2.2000 | Freq: Once | INTRAVENOUS | Status: AC | PRN
  Administered 2012-01-16: 2.2 via INTRAVENOUS

## 2012-01-22 ENCOUNTER — Other Ambulatory Visit: Payer: Self-pay | Admitting: Gastroenterology

## 2012-01-22 MED ORDER — DEXLANSOPRAZOLE 60 MG PO CPDR: 60.0000 mg | DELAYED_RELEASE_CAPSULE | Freq: Every day | ORAL | Status: DC

## 2012-01-24 ENCOUNTER — Telehealth: Payer: Self-pay | Admitting: Gastroenterology

## 2012-01-24 NOTE — Telephone Encounter (Signed)
Got prior auth approved for Dexilant 60mg  approved until 01/23/2013

## 2012-02-05 ENCOUNTER — Ambulatory Visit (INDEPENDENT_AMBULATORY_CARE_PROVIDER_SITE_OTHER): Payer: Medicare Other | Admitting: Gastroenterology

## 2012-02-05 ENCOUNTER — Encounter: Payer: Self-pay | Admitting: Gastroenterology

## 2012-02-05 VITALS — BP 116/70 | HR 60 | Ht 72.5 in | Wt 162.1 lb

## 2012-02-05 DIAGNOSIS — R112 Nausea with vomiting, unspecified: Secondary | ICD-10-CM

## 2012-02-05 NOTE — Patient Instructions (Signed)
Follow up as needed

## 2012-02-05 NOTE — Assessment & Plan Note (Signed)
Symptoms of postprandial nausea and vomiting suggest a partial gastric outlet obstruction or gastroparesis.  Last endoscopy in 2010 was normal.  Since his last visit symptoms have entirely subsided. Etiology is uncertain.  Medications #1 continue dexilant

## 2012-02-05 NOTE — Progress Notes (Signed)
History of Present Illness:  Bradley Ramirez has returned for followup of nausea and vomiting. Gastric emptying scan was normal. He currently is taking dexilant. Symptoms have entirely subsided. He is without other GI complaints.    Review of Systems: Pertinent positive and negative review of systems were noted in the above HPI section. All other review of systems were otherwise negative.    Current Medications, Allergies, Past Medical History, Past Surgical History, Family History and Social History were reviewed in [employer] electronic medical record  Vital signs were reviewed in today's medical record. Physical Exam: General: Well developed , well nourished, no acute distress

## 2012-03-06 ENCOUNTER — Other Ambulatory Visit: Payer: Self-pay | Admitting: Gastroenterology

## 2012-05-07 ENCOUNTER — Other Ambulatory Visit: Payer: Self-pay | Admitting: Gastroenterology

## 2012-06-02 ENCOUNTER — Encounter: Payer: Self-pay | Admitting: Cardiology

## 2012-06-02 ENCOUNTER — Ambulatory Visit (INDEPENDENT_AMBULATORY_CARE_PROVIDER_SITE_OTHER): Payer: Medicare Other | Admitting: Cardiology

## 2012-06-02 VITALS — BP 112/68 | HR 66 | Ht 74.0 in | Wt 160.1 lb

## 2012-06-02 DIAGNOSIS — I422 Other hypertrophic cardiomyopathy: Secondary | ICD-10-CM

## 2012-06-02 NOTE — Patient Instructions (Addendum)

## 2012-06-02 NOTE — Progress Notes (Signed)
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 I saw him last year and checked an echocardiogram. His EF was unchanged at about 45%. Previously documented pulmonary insufficiency was mild. Since that time he has done very well. He remains active. He denies any symptoms. In particular he denies any shortness of breath. He has no PND or orthopnea. He denies any palpitations, presyncope or syncope. He has had no weight gain or edema.  No Known Allergies  Current Outpatient Prescriptions  Medication Sig Dispense Refill  . aspirin 81 MG tablet Take 81 mg by mouth daily.        Marland Kitchen b complex vitamins tablet Take 1 tablet by mouth daily.        . Cholecalciferol (VITAMIN D3) 3000 UNITS TABS Take 2,000 Units by mouth daily.       Marland Kitchen DEXILANT 60 MG capsule TAKE 1 CAPSULE (60 MG TOTAL) BY MOUTH DAILY.  30 capsule  3  . diltiazem (CARDIZEM CD) 180 MG 24 hr capsule TAKE 1 CAPSULE EVERY DAY  30 capsule  1    Past Medical History  Diagnosis Date  . Hiatal hernia   . GERD (gastroesophageal reflux disease)   . Hypertension   . Esophageal spasm   . Dysphagia, unspecified   . Esophageal stricture   . Chest pain   . Murmur, diastolic   . Deafness   . S/P colonoscopy     Past Surgical History  Procedure Date  . Tonsillectomy     ROS:  As stated in the HPI and negative for all other systems.  PHYSICAL EXAM BP 112/68  Pulse 66  Ht 6\' 1"  (1.854 m)  Wt 160 lb 1.9 oz (72.63 kg)  BMI 21.13 kg/m2 GENERAL:  Well appearing HEENT:  Pupils equal round and reactive, fundi not visualized, oral mucosa unremarkable NECK:  No jugular venous distention, waveform within normal limits, carotid upstroke brisk and symmetric, no bruits, no thyromegaly LYMPHATICS:  No cervical, inguinal adenopathy LUNGS:  Clear to auscultation bilaterally BACK:  No CVA tenderness CHEST:  Unremarkable HEART:  PMI not displaced or sustained,S1 and S2 within normal limits, no S3, no S4, no clicks, no  rubs, soft upper left sternal border diastolic murmur ABD:  Flat, positive bowel sounds normal in frequency in pitch, no bruits, no rebound, no guarding, no midline pulsatile mass, no hepatomegaly, no splenomegaly EXT:  2 plus pulses throughout, no edema, no cyanosis no clubbing SKIN:  No rashes no nodules NEURO:  Cranial nerves II through XII grossly intact, motor grossly intact throughout PSYCH:  Cognitively intact, oriented to person place and time  EKG:  Sinus rhythm, rate 66, axis within normal limits, intervals within normal limits, premature atrial contractions, no acute ST-T wave changes.  06/02/2012  ASSESSMENT AND PLAN  CARDIOMYOPATHY -  He had a mildly reduced ejection fraction on echo last year wiith some pulmonary insufficiency. However, this was stable over serial echocardiograms and he's had no symptoms. I will bring him back next year for a repeat echo. Otherwise he can come back veryto see me before this if he develops any symptoms  HYPERTENSION, UNSPECIFIED -  The blood pressure is at target. No change in medications is indicated. We will continue with therapeutic lifestyle changes (TLC).   PAC (premature atrial contraction) -  He's having no palpitations. No change in therapy is indicated.   Chest pain -  He has had no recent chest pain since and had a negative stress test  in the past a. No further testing is indicated.

## 2012-06-10 ENCOUNTER — Telehealth: Payer: Self-pay | Admitting: *Deleted

## 2012-06-10 NOTE — Telephone Encounter (Signed)
Left patient a message saying echo has been canceled for 06/11/12 @ 1 pm. Echo is not due until 9//2014.

## 2012-06-11 ENCOUNTER — Other Ambulatory Visit (HOSPITAL_COMMUNITY): Payer: Medicare Other

## 2012-07-20 ENCOUNTER — Encounter: Payer: Self-pay | Admitting: Internal Medicine

## 2012-07-20 ENCOUNTER — Ambulatory Visit (INDEPENDENT_AMBULATORY_CARE_PROVIDER_SITE_OTHER): Payer: Medicare Other | Admitting: Internal Medicine

## 2012-07-20 VITALS — BP 130/74 | HR 70 | Temp 97.6°F | Resp 16 | Ht 72.0 in | Wt 160.0 lb

## 2012-07-20 DIAGNOSIS — N400 Enlarged prostate without lower urinary tract symptoms: Secondary | ICD-10-CM

## 2012-07-20 DIAGNOSIS — H9110 Presbycusis, unspecified ear: Secondary | ICD-10-CM

## 2012-07-20 DIAGNOSIS — Z0000 Encounter for general adult medical examination without abnormal findings: Secondary | ICD-10-CM

## 2012-07-20 DIAGNOSIS — R5381 Other malaise: Secondary | ICD-10-CM

## 2012-07-20 DIAGNOSIS — Z13220 Encounter for screening for lipoid disorders: Secondary | ICD-10-CM

## 2012-07-20 DIAGNOSIS — K219 Gastro-esophageal reflux disease without esophagitis: Secondary | ICD-10-CM

## 2012-07-20 DIAGNOSIS — I10 Essential (primary) hypertension: Secondary | ICD-10-CM

## 2012-07-20 DIAGNOSIS — Z125 Encounter for screening for malignant neoplasm of prostate: Secondary | ICD-10-CM

## 2012-07-20 DIAGNOSIS — Z23 Encounter for immunization: Secondary | ICD-10-CM

## 2012-07-20 LAB — POCT URINALYSIS DIPSTICK
Bilirubin, UA: NEGATIVE
Glucose, UA: NEGATIVE
Ketones, UA: NEGATIVE
Spec Grav, UA: 1.01
pH, UA: 7

## 2012-07-20 LAB — CBC WITH DIFFERENTIAL/PLATELET
Eosinophils Absolute: 0 10*3/uL (ref 0.0–0.7)
Eosinophils Relative: 0 % (ref 0–5)
HCT: 37 % — ABNORMAL LOW (ref 39.0–52.0)
Hemoglobin: 12.5 g/dL — ABNORMAL LOW (ref 13.0–17.0)
Lymphs Abs: 0.7 10*3/uL (ref 0.7–4.0)
MCH: 30.5 pg (ref 26.0–34.0)
MCHC: 33.8 g/dL (ref 30.0–36.0)
MCV: 90.2 fL (ref 78.0–100.0)
Monocytes Absolute: 0.5 10*3/uL (ref 0.1–1.0)
Monocytes Relative: 10 % (ref 3–12)
Neutrophils Relative %: 74 % (ref 43–77)
RBC: 4.1 MIL/uL — ABNORMAL LOW (ref 4.22–5.81)

## 2012-07-20 LAB — LIPID PANEL
Cholesterol: 162 mg/dL (ref 0–200)
HDL: 47 mg/dL (ref >39)

## 2012-07-20 LAB — POCT UA - MICROSCOPIC ONLY
Bacteria, U Microscopic: NEGATIVE
Mucus, UA: NEGATIVE
WBC, Ur, HPF, POC: NEGATIVE

## 2012-07-20 LAB — COMPREHENSIVE METABOLIC PANEL
Albumin: 4.3 g/dL (ref 3.5–5.2)
BUN: 18 mg/dL (ref 6–23)
CO2: 27 mEq/L (ref 19–32)
Glucose, Bld: 88 mg/dL (ref 70–99)
Sodium: 139 mEq/L (ref 135–145)
Total Bilirubin: 0.3 mg/dL (ref 0.3–1.2)
Total Protein: 6.9 g/dL (ref 6.0–8.3)

## 2012-07-20 LAB — PSA, MEDICARE: PSA: 2.29 ng/mL (ref <=4.00)

## 2012-07-20 LAB — TSH: TSH: 2.014 u[IU]/mL (ref 0.350–4.500)

## 2012-07-20 MED ORDER — DILTIAZEM HCL ER COATED BEADS 180 MG PO CP24: 180.0000 mg | ORAL_CAPSULE | Freq: Every day | ORAL | Status: DC

## 2012-07-20 NOTE — Progress Notes (Signed)
  Subjective:    Patient ID: Bradley Ramirez, male    DOB: [birth year], 71 y.o.   MRN: [id number]  HPI Wife Bradley Ramirez with him today,she thinks he is too skinny. Ideal weight for him is 170lbs. HTN controlled BPH and heart murmur stable Gerd/esoph stricture stable Using sign interpretor Review of Systems stable    Objective:   Physical Exam  Constitutional: He is oriented to person, place, and time. He appears well-developed and well-nourished. No distress.  HENT:  Right Ear: External ear normal.  Left Ear: External ear normal.  Nose: Nose normal.  Mouth/Throat: Oropharynx is clear and moist.       totaly deaf  Eyes: EOM are normal. Pupils are equal, round, and reactive to light.  Neck: Normal range of motion. Neck supple.  Cardiovascular: Normal rate and regular rhythm.  Exam reveals no gallop.   Murmur heard. Pulmonary/Chest: Effort normal and breath sounds normal.  Abdominal: Soft. He exhibits no mass. There is no tenderness.  Musculoskeletal: Normal range of motion.  Lymphadenopathy:    He has no cervical adenopathy.  Neurological: He is alert and oriented to person, place, and time. He has normal reflexes. No cranial nerve deficit. He exhibits normal muscle tone. Coordination normal.  Skin: Skin is warm and dry.  Psychiatric: He has a normal mood and affect. His behavior is normal.          Assessment & Plan:  Flu vac Shingles vac RF meds Carnation instant breakfast for weight

## 2012-07-20 NOTE — Patient Instructions (Addendum)
Gastroesophageal Reflux Disease, Adult Gastroesophageal reflux disease (GERD) happens when acid from your stomach flows up into the esophagus. When acid comes in contact with the esophagus, the acid causes soreness (inflammation) in the esophagus. Over time, GERD may create small holes (ulcers) in the lining of the esophagus. CAUSES   Increased body weight. This puts pressure on the stomach, making acid rise from the stomach into the esophagus.  Smoking. This increases acid production in the stomach.  Drinking alcohol. This causes decreased pressure in the lower esophageal sphincter (valve or ring of muscle between the esophagus and stomach), allowing acid from the stomach into the esophagus.  Late evening meals and a full stomach. This increases pressure and acid production in the stomach.  A malformed lower esophageal sphincter. Sometimes, no cause is found. SYMPTOMS   Burning pain in the lower part of the mid-chest behind the breastbone and in the mid-stomach area. This may occur twice a week or more often.  Trouble swallowing.  Sore throat.  Dry cough.  Asthma-like symptoms including chest tightness, shortness of breath, or wheezing. DIAGNOSIS  Your caregiver may be able to diagnose GERD based on your symptoms. In some cases, X-rays and other tests may be done to check for complications or to check the condition of your stomach and esophagus. TREATMENT  Your caregiver may recommend over-the-counter or prescription medicines to help decrease acid production. Ask your caregiver before starting or adding any new medicines.  HOME CARE INSTRUCTIONS   Change the factors that you can control. Ask your caregiver for guidance concerning weight loss, quitting smoking, and alcohol consumption.  Avoid foods and drinks that make your symptoms worse, such as:  Caffeine or alcoholic drinks.  Chocolate.  Peppermint or mint flavorings.  Garlic and onions.  Spicy foods.  Citrus fruits,  such as oranges, lemons, or limes.  Tomato-based foods such as sauce, chili, salsa, and pizza.  Fried and fatty foods.  Avoid lying down for the 3 hours prior to your bedtime or prior to taking a nap.  Eat small, frequent meals instead of large meals.  Wear loose-fitting clothing. Do not wear anything tight around your waist that causes pressure on your stomach.  Raise the head of your bed 6 to 8 inches with wood blocks to help you sleep. Extra pillows will not help.  Only take over-the-counter or prescription medicines for pain, discomfort, or fever as directed by your caregiver.  Do not take aspirin, ibuprofen, or other nonsteroidal anti-inflammatory drugs (NSAIDs). SEEK IMMEDIATE MEDICAL CARE IF:   You have pain in your arms, neck, jaw, teeth, or back.  Your pain increases or changes in intensity or duration.  You develop nausea, vomiting, or sweating (diaphoresis).  You develop shortness of breath, or you faint.  Your vomit is green, yellow, black, or looks like coffee grounds or blood.  Your stool is red, bloody, or black. These symptoms could be signs of other problems, such as heart disease, gastric bleeding, or esophageal bleeding. MAKE SURE YOU:   Understand these instructions.  Will watch your condition.  Will get help right away if you are not doing well or get worse. Document Released: 06/19/2005 Document Revised: 12/02/2011 Document Reviewed: 03/29/2011 [facility] Patient Information 2013 [place], [place]. Heart Murmur A heart murmur is an extra sound heard by your caregiver when listening to your heart with a device called a stethoscope. The sound might be a "hum" or "whoosh" sound heard when the heart beats. The sound comes from turbulence when blood  flows through the heart. There are two types of heart murmurs:  Innocent (Harmless) murmurs: Most people with this type of heart murmur do not have signs or symptoms of heart problems. Many children have innocent  heart murmurs. When an innocent heart murmur is found, there is no need to get tests or do treatment. Also, there is no need to restrict activities or stop playing sports. Innocent heart murmurs may be caused by many things. For example, it might be caused by a tiny hole or defect in the wall of the heart. These defects often close as a child grows. An innocent heart murmur may be heard by an examining clinician throughout your life. If you see a new caregiver, please let him or her know this was found during past exams.  Abnormal murmurs: May have signs and symptoms of heart problems. These types of murmurs can occur in children and adults. In children, abnormal heart murmurs are typically caused from heart defects that are present at birth. In adults, abnormal murmurs are usually from heart valve problems caused by disease, infection, or aging. SYMPTOMS   Innocent (Harmless) murmurs do not cause symptoms or require you to limit physical activity.  Many people with abnormal murmurs may or may not have symptoms. If symptoms do develop, they might include:  Shortness of breath.  Blue coloring of the skin, especially on the fingertips.  Chest pain.  Palpitations or feeling a "fluttering" or a "skipped" heart beat.  Fainting.  Persistent cough.  Getting tired much faster than expected. DIAGNOSIS  A heart murmur might be heard during a pre-sports physical or during any type of examination. When a murmur is heard, it may suggest a possible problem. When this happens, your caregiver may ask you to see a heart specialist (cardiologist). You may also be asked to undergo one or more heart tests. In these cases, testing may vary depending upon what your caregiver heard. Tests for a heart murmur might include one or more of the following:  EKG (electrocardiogram).  Echocardiogram.  Cardiac MRI. For children and adults who have an abnormal heart murmur and want to play sports, it is important to  complete testing, review test results, and receive recommendations from your caregiver. If heart disease is present, it may be risky to play. Finding out the results of your test Not all test results are available during your visit. If your test results are not back during the visit, make an appointment with your caregiver to find out the results. Do not assume everything is normal if you have not heard from your caregiver or the medical facility. It is important for you to follow up on all of your test results.  TREATMENT  As noted above, innocent (harmless) murmurs require no treatment or activity restriction. If the murmur represents a problem with the heart, treatment will depend upon the exact nature of the problem. In these cases, medicine or surgery may be needed to treat the problem. HOME CARE INSTRUCTIONS If you want to participate in sports or other types of strenuous physical activity, it is important to discuss this first with your caregiver. If the murmur represents a problem with the heart and you choose to participate in sports, there is a small chance that a serious problem (including sudden death) could result.  SEEK MEDICAL CARE IF:   You feel that your symptoms are slowly worsening.  You develop any new symptoms that cause concern.  You feel that you are having side effects  from any medicines prescribed. SEEK IMMEDIATE MEDICAL CARE IF:   Chest pain develops.  You are short of breath.  You notice that your heart beats irregularly often enough to cause you to worry.  You have fainting spells.  There is a worsening of any problems that brought you or your child in for medical care. Document Released: 10/17/2004 Document Revised: 12/02/2011 Document Reviewed: 11/17/2007 [facility] Patient Information 2013 [place], [place].

## 2012-07-21 ENCOUNTER — Encounter: Payer: Self-pay | Admitting: Radiology

## 2012-07-24 LAB — IFOBT (OCCULT BLOOD): IFOBT: NEGATIVE

## 2012-10-20 ENCOUNTER — Telehealth: Payer: Self-pay

## 2012-10-20 NOTE — Telephone Encounter (Signed)
Patients interpreter calling to see if Dr. Perrin Maltese can approve this rx diltiazem (CARDIZEM CD) 180 MG 24 hr capsule [[id number]] to be refilled. Thank you!

## 2012-10-21 NOTE — Telephone Encounter (Signed)
Notified pt that he should have enough refills and also called pharmacy and they had it on his file and they will get it ready.

## 2013-06-08 ENCOUNTER — Other Ambulatory Visit (HOSPITAL_COMMUNITY): Payer: Medicare Other

## 2013-06-08 ENCOUNTER — Encounter: Payer: Self-pay | Admitting: Cardiology

## 2013-06-08 ENCOUNTER — Ambulatory Visit (INDEPENDENT_AMBULATORY_CARE_PROVIDER_SITE_OTHER): Payer: Medicare Other | Admitting: Cardiology

## 2013-06-08 ENCOUNTER — Ambulatory Visit (HOSPITAL_COMMUNITY): Payer: Medicare Other | Attending: Cardiology | Admitting: Radiology

## 2013-06-08 VITALS — BP 122/80 | HR 60 | Ht 72.0 in | Wt 164.8 lb

## 2013-06-08 DIAGNOSIS — I10 Essential (primary) hypertension: Secondary | ICD-10-CM | POA: Insufficient documentation

## 2013-06-08 DIAGNOSIS — R079 Chest pain, unspecified: Secondary | ICD-10-CM | POA: Insufficient documentation

## 2013-06-08 DIAGNOSIS — I422 Other hypertrophic cardiomyopathy: Secondary | ICD-10-CM

## 2013-06-08 DIAGNOSIS — R634 Abnormal weight loss: Secondary | ICD-10-CM

## 2013-06-08 DIAGNOSIS — Z87891 Personal history of nicotine dependence: Secondary | ICD-10-CM | POA: Insufficient documentation

## 2013-06-08 DIAGNOSIS — I428 Other cardiomyopathies: Secondary | ICD-10-CM | POA: Insufficient documentation

## 2013-06-08 DIAGNOSIS — I491 Atrial premature depolarization: Secondary | ICD-10-CM

## 2013-06-08 LAB — CBC
HCT: 34.6 % — ABNORMAL LOW (ref 39.0–52.0)
Hemoglobin: 11.6 g/dL — ABNORMAL LOW (ref 13.0–17.0)
MCHC: 33.4 g/dL (ref 30.0–36.0)
MCV: 93.6 fl (ref 78.0–100.0)
Platelets: 202 K/uL (ref 150.0–400.0)
RBC: 3.7 Mil/uL — ABNORMAL LOW (ref 4.22–5.81)
RDW: 13.8 % (ref 11.5–14.6)
WBC: 5.9 K/uL (ref 4.5–10.5)

## 2013-06-08 LAB — TSH: TSH: 1.35 u[IU]/mL (ref 0.35–5.50)

## 2013-06-08 NOTE — Progress Notes (Signed)
Echocardiogram performed.  

## 2013-06-08 NOTE — Patient Instructions (Addendum)
The current medical regimen is effective;  continue present plan and medications.  Follow up in 1 year with Dr Hochrein.  You will receive a letter in the mail 2 months before you are due.  Please call us when you receive this letter to schedule your follow up appointment.  

## 2013-06-08 NOTE — Progress Notes (Signed)
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  His ejection fraction has been about 45%.  Over the past year he has done quite well. He denies any chest pressure, neck or arm discomfort. He denies any palpitations, presyncope or syncope. He has had no new shortness of breath, PND or orthopnea. He has had no weight gain or edema. He still is active and works periodically. He has been losing weight despite feeling good and having a good appetite. His wife is concerned about this.  No Known Allergies  Current Outpatient Prescriptions  Medication Sig Dispense Refill  . aspirin 81 MG tablet Take 81 mg by mouth daily.        Marland Kitchen b complex vitamins tablet Take 1 tablet by mouth daily.        . Cholecalciferol (VITAMIN D3) 3000 UNITS TABS Take 2,000 Units by mouth daily.       Marland Kitchen diltiazem (CARDIZEM CD) 180 MG 24 hr capsule Take 1 capsule (180 mg total) by mouth daily.  90 capsule  3  . pantoprazole (PROTONIX) 40 MG tablet Take 40 mg by mouth daily.       No current facility-administered medications for this visit.    Past Medical History  Diagnosis Date  . Hiatal hernia   . GERD (gastroesophageal reflux disease)   . Hypertension   . Esophageal spasm   . Dysphagia, unspecified(787.20)   . Esophageal stricture   . Chest pain   . Murmur, diastolic   . Deafness   . S/P colonoscopy     Past Surgical History  Procedure Laterality Date  . Tonsillectomy      ROS:  As stated in the HPI and negative for all other systems.  PHYSICAL EXAM BP 122/80  Pulse 60  Ht 6' (1.829 m)  Wt 164 lb 12.8 oz (74.753 kg)  BMI 22.35 kg/m2 GENERAL:  Well appearing HEENT:  Pupils equal round and reactive, fundi not visualized, oral mucosa unremarkable, dentures NECK:  No jugular venous distention, waveform within normal limits, carotid upstroke brisk and symmetric, no bruits, no thyromegaly LYMPHATICS:  No cervical, inguinal adenopathy LUNGS:  Clear to auscultation  bilaterally BACK:  No CVA tenderness CHEST:  Unremarkable HEART:  PMI not displaced or sustained,S1 and S2 within normal limits, no S3, no S4, no clicks, no rubs, soft upper left sternal border diastolic murmur ABD:  Flat, positive bowel sounds normal in frequency in pitch, no bruits, no rebound, no guarding, no midline pulsatile mass, no hepatomegaly, no splenomegaly EXT:  2 plus pulses throughout, no edema, no cyanosis no clubbing SKIN:  No rashes no nodules NEURO:  Cranial nerves II through XII grossly intact, motor grossly intact throughout PSYCH:  Cognitively intact, oriented to person place and time  EKG:  Sinus rhythm, rate 60, axis within normal limits, intervals within normal limits, premature atrial contractions, no acute ST-T wave changes.  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  I reviewed the echo done today briefly and it appears to be unchanged. I will for final result. However, and in the absence of symptoms no change in therapy is indicated  HYPERTENSION, UNSPECIFIED -  The blood pressure is at target. No change in medications is indicated. We will continue with therapeutic lifestyle changes (TLC).   WEIGHT LOSS - I will check a TSH and a CBC. However, the patient feels well. I don't see an overt etiology.  If he continues to have symptoms he should follow with GUEST, Loretha Stapler, MD

## 2013-07-07 ENCOUNTER — Other Ambulatory Visit: Payer: Self-pay

## 2013-07-07 DIAGNOSIS — I10 Essential (primary) hypertension: Secondary | ICD-10-CM

## 2013-07-07 MED ORDER — DILTIAZEM HCL ER COATED BEADS 180 MG PO CP24: 180.0000 mg | ORAL_CAPSULE | Freq: Every day | ORAL | Status: DC

## 2013-07-26 ENCOUNTER — Encounter: Payer: Medicare Other | Admitting: Internal Medicine

## 2013-08-02 ENCOUNTER — Ambulatory Visit (INDEPENDENT_AMBULATORY_CARE_PROVIDER_SITE_OTHER): Payer: Medicare Other | Admitting: Internal Medicine

## 2013-08-02 DIAGNOSIS — Z23 Encounter for immunization: Secondary | ICD-10-CM

## 2013-09-12 ENCOUNTER — Other Ambulatory Visit: Payer: Self-pay | Admitting: Internal Medicine

## 2013-09-24 ENCOUNTER — Other Ambulatory Visit: Payer: Self-pay | Admitting: Internal Medicine

## 2013-10-28 ENCOUNTER — Ambulatory Visit: Payer: Medicare Other

## 2013-10-28 ENCOUNTER — Ambulatory Visit (INDEPENDENT_AMBULATORY_CARE_PROVIDER_SITE_OTHER): Payer: Medicare Other | Admitting: Family Medicine

## 2013-10-28 ENCOUNTER — Encounter: Payer: Self-pay | Admitting: Family Medicine

## 2013-10-28 VITALS — BP 144/85 | HR 77 | Temp 97.9°F | Resp 16 | Ht 72.5 in | Wt 164.4 lb

## 2013-10-28 DIAGNOSIS — H6120 Impacted cerumen, unspecified ear: Secondary | ICD-10-CM

## 2013-10-28 DIAGNOSIS — R079 Chest pain, unspecified: Secondary | ICD-10-CM

## 2013-10-28 DIAGNOSIS — Z0000 Encounter for general adult medical examination without abnormal findings: Secondary | ICD-10-CM

## 2013-10-28 DIAGNOSIS — Z1211 Encounter for screening for malignant neoplasm of colon: Secondary | ICD-10-CM

## 2013-10-28 DIAGNOSIS — H9190 Unspecified hearing loss, unspecified ear: Secondary | ICD-10-CM | POA: Insufficient documentation

## 2013-10-28 DIAGNOSIS — J449 Chronic obstructive pulmonary disease, unspecified: Secondary | ICD-10-CM

## 2013-10-28 LAB — CBC WITH DIFFERENTIAL/PLATELET
Basophils Absolute: 0 10*3/uL (ref 0.0–0.1)
Basophils Relative: 1 % (ref 0–1)
Eosinophils Absolute: 0.1 10*3/uL (ref 0.0–0.7)
Eosinophils Relative: 1 % (ref 0–5)
HCT: 36.2 % — ABNORMAL LOW (ref 39.0–52.0)
Hemoglobin: 12.1 g/dL — ABNORMAL LOW (ref 13.0–17.0)
Lymphocytes Relative: 22 % (ref 12–46)
Lymphs Abs: 1.1 10*3/uL (ref 0.7–4.0)
MCH: 31.1 pg (ref 26.0–34.0)
MCHC: 33.4 g/dL (ref 30.0–36.0)
MCV: 93.1 fL (ref 78.0–100.0)
Monocytes Absolute: 0.4 10*3/uL (ref 0.1–1.0)
Monocytes Relative: 8 % (ref 3–12)
Neutro Abs: 3.3 10*3/uL (ref 1.7–7.7)
Neutrophils Relative %: 68 % (ref 43–77)
Platelets: 201 10*3/uL (ref 150–400)
RBC: 3.89 MIL/uL — ABNORMAL LOW (ref 4.22–5.81)
RDW: 13.7 % (ref 11.5–15.5)
WBC: 4.8 10*3/uL (ref 4.0–10.5)

## 2013-10-28 LAB — COMPLETE METABOLIC PANEL WITH GFR
ALT: 11 U/L (ref 0–53)
AST: 19 U/L (ref 0–37)
Albumin: 4.1 g/dL (ref 3.5–5.2)
Alkaline Phosphatase: 83 U/L (ref 39–117)
BUN: 16 mg/dL (ref 6–23)
CO2: 28 mEq/L (ref 19–32)
Calcium: 9 mg/dL (ref 8.4–10.5)
Chloride: 106 mEq/L (ref 96–112)
Creat: 1.31 mg/dL (ref 0.50–1.35)
GFR, Est African American: 62 mL/min
GFR, Est Non African American: 54 mL/min — ABNORMAL LOW
Glucose, Bld: 84 mg/dL (ref 70–99)
Potassium: 4.2 mEq/L (ref 3.5–5.3)
Sodium: 141 mEq/L (ref 135–145)
Total Bilirubin: 0.4 mg/dL (ref 0.2–1.2)
Total Protein: 7.1 g/dL (ref 6.0–8.3)

## 2013-10-28 LAB — IFOBT (OCCULT BLOOD): IFOBT: NEGATIVE

## 2013-10-28 LAB — POCT URINALYSIS DIPSTICK
Bilirubin, UA: NEGATIVE
Glucose, UA: NEGATIVE
Ketones, UA: NEGATIVE
Nitrite, UA: NEGATIVE
Protein, UA: NEGATIVE
Spec Grav, UA: 1.02
Urobilinogen, UA: 0.2
pH, UA: 7

## 2013-10-28 LAB — LIPID PANEL
Cholesterol: 180 mg/dL (ref 0–200)
HDL: 54 mg/dL (ref >39)
LDL Cholesterol: 114 mg/dL — ABNORMAL HIGH (ref 0–99)
Total CHOL/HDL Ratio: 3.3 Ratio
Triglycerides: 62 mg/dL (ref <150)
VLDL: 12 mg/dL (ref 0–40)

## 2013-10-28 MED ORDER — FLUTICASONE FUROATE 100 MCG/ACT IN AEPB: 1.0000 | INHALATION_SPRAY | Freq: Every morning | RESPIRATORY_TRACT | Status: DC

## 2013-10-28 MED ORDER — BECLOMETHASONE DIPROPIONATE 80 MCG/ACT IN AERS: 2.0000 | INHALATION_SPRAY | Freq: Two times a day (BID) | RESPIRATORY_TRACT | Status: DC

## 2013-10-28 NOTE — Progress Notes (Signed)
   Subjective:    Patient ID: Bradley Ramirez, male    DOB: 05-15-1941, 73 y.o.   MRN: [id number]  HPI    Review of Systems  Constitutional: Negative.   HENT: Positive for hearing loss.   Eyes: Negative.   Respiratory: Negative.   Cardiovascular: Negative.   Gastrointestinal: Negative.   Endocrine: Negative.   Genitourinary: Negative.   Musculoskeletal: Negative.   Skin: Negative.   Allergic/Immunologic: Negative.   Neurological: Positive for dizziness.  Hematological: Negative.   Psychiatric/Behavioral: Negative.        Objective:   Physical Exam        Assessment & Plan:

## 2013-10-28 NOTE — Progress Notes (Addendum)
Subjective:  This chart was scribed for Robyn Haber, MD by Donato Schultz, Medical Scribe. This patient was seen in Room 22 and the patient's care was started at 10:33 AM.   Patient ID: Bradley Ramirez, male    DOB: 1941-06-11, 73 y.o.   MRN: [id number]  HPI HPI Comments: Bradley Ramirez is a 73 y.o. male with a history of deafness who presents to the [facility] needing an annual exam.  He states that he will experience mild heartburn and intermittent cough productive of green sputum.  He denies nausea or emesis as associated symptoms.  He states that he will experience mild SOB.  He states that he has taken OTC cough medication with no relief to his symptoms.  He denies using an inhaler in the past.  He states that the sputum seems to come from the lungs. He states that his dentures are fitting well.  The patient states that he stopped smoking in 1999 and quit drinking in 1987.  The patient states that he is working part-time at [employer].  He states that he retired in 2005 but the administration begged him to come back.    The patient states that he saw a cardiologist in September of 2014 for an ultrasound which revealed normal results.   The patient encounter was interpreted.  Past Medical History  Diagnosis Date   Hiatal hernia    GERD (gastroesophageal reflux disease)    Hypertension    Esophageal spasm    Dysphagia, unspecified(787.20)    Esophageal stricture    Chest pain    Murmur, diastolic    Deafness    S/P colonoscopy    Past Surgical History  Procedure Laterality Date   Tonsillectomy     Family History  Problem Relation Age of Onset   Uterine cancer Mother    Diabetes Maternal Grandfather    Diabetes Maternal Grandmother    Diabetes Maternal Uncle    Colon cancer Neg Hx    History   Social History   Marital Status: Married    Spouse Name: N/A    Number of Children: 4   Years of Education: N/A   Occupational History    retired        Social History Main Topics   Smoking status: Former Smoker    Types: Cigarettes    Quit date: 09/23/1996   Smokeless tobacco: Never Used   Alcohol Use: No     Comment: quit 1987   Drug Use: No   Sexual Activity: Not on file   Other Topics Concern   Not on file   Social History Narrative   No narrative on file   No Known Allergies  Review of Systems  Respiratory: Positive for cough and shortness of breath (mild).   Gastrointestinal: Negative for nausea and vomiting.  All other systems reviewed and are negative.     Objective:  Physical Exam  Nursing note and vitals reviewed. Constitutional: He is oriented to person, place, and time. He appears well-developed and well-nourished. No distress.  HENT:  Head: Normocephalic and atraumatic.  Right Ear: External ear normal. Decreased hearing is noted.  Left Ear: External ear normal. Decreased hearing is noted.  Mouth/Throat: Oropharynx is clear and moist. No oropharyngeal exudate.  Increased wax in bilateral canals.   Eyes: Conjunctivae and EOM are normal. Pupils are equal, round, and reactive to light. Right eye exhibits no discharge. Left eye exhibits no discharge. Right conjunctiva is not injected. Right conjunctiva  has no hemorrhage. Left conjunctiva is not injected. Left conjunctiva has no hemorrhage. No scleral icterus. Right eye exhibits normal extraocular motion and no nystagmus. Left eye exhibits normal extraocular motion and no nystagmus.  Fundoscopic exam:      The right eye shows no arteriolar narrowing, no AV nicking, no exudate, no hemorrhage and no papilledema.       The left eye shows no arteriolar narrowing, no AV nicking, no exudate, no hemorrhage and no papilledema.  Neck: Normal range of motion. Neck supple. No JVD present. No tracheal deviation present. No thyromegaly present.  Cardiovascular: Normal rate, regular rhythm, normal heart sounds and intact distal pulses.  Exam reveals no  gallop and no friction rub.   No murmur heard. Pulmonary/Chest: Effort normal. No stridor. No respiratory distress. He has no wheezes. He has no rales. He exhibits no tenderness.  Distant breath sounds, narrow chest  Abdominal: Soft. Bowel sounds are normal. He exhibits no distension and no mass. There is no tenderness. There is no rebound and no guarding. Hernia confirmed negative in the right inguinal area and confirmed negative in the left inguinal area.  Genitourinary: Rectum normal, testes normal and penis normal. Right testis shows no mass and no tenderness. Left testis shows no mass and no tenderness. Circumcised. No penile tenderness.  Diffusely enlarged, smooth prostate  Musculoskeletal: Normal range of motion. He exhibits no edema and no tenderness.  Lymphadenopathy:    He has no cervical adenopathy.       Right: No inguinal adenopathy present.       Left: No inguinal adenopathy present.  Neurological: He is alert and oriented to person, place, and time. No cranial nerve deficit. He exhibits normal muscle tone. Coordination normal.  Skin: Skin is warm and dry. No rash noted.  Psychiatric: He has a normal mood and affect. His behavior is normal. Judgment and thought content normal.    [facility] reading (PRIMARY) by  Dr. Joseph Art  CXR.  hyperaerated c/w COPD  Results for orders placed in visit on 10/28/13  POCT URINALYSIS DIPSTICK      Result Value Range   Color, UA YELLOW     Clarity, UA CLEAR     Glucose, UA NEG     Bilirubin, UA NEG     Ketones, UA NEG     Spec Grav, UA 1.020     Blood, UA TRACE-INTACT     pH, UA 7.0     Protein, UA NEG     Urobilinogen, UA 0.2     Nitrite, UA NEG     Leukocytes, UA small (1+)        BP 144/85   Pulse 77   Temp(Src) 97.9 F (36.6 C) (Oral)   Resp 16   Ht 6' 0.5" (1.842 m)   Wt 164 lb 6.4 oz (74.571 kg)   BMI 21.98 kg/m2   SpO2 96% Assessment & Plan:    I personally performed the services described in this documentation, which was  scribed in my presence. The recorded information has been reviewed and is accurate.  The persistent substernal discomfort in the context of lack of improvement with PPI medication and normal ejection fraction suggest the patient has chronic bronchitis. Given his smoking history (he quit 15 years ago) and his body habitus with decreased sounds, I believe he has COPD and would benefit from an inhaler. I do not believe that any reflux or acid reducing measures are indicated.  Routine general medical examination at a health  care facility - Plan: CBC with Differential, COMPLETE METABOLIC PANEL WITH GFR, Lipid panel, POCT urinalysis dipstick, DISCONTINUED: Fluticasone Furoate (ARNUITY ELLIPTA) 100 MCG/ACT AEPB  Chest pain - Plan: DG Chest 2 View, DISCONTINUED: Fluticasone Furoate (ARNUITY ELLIPTA) 100 MCG/ACT AEPB  COPD (chronic obstructive pulmonary disease)  Screening for colon cancer - Plan: IFOBT POC (occult bld, rslt in office)  Cerumen impaction - Plan: Ear Lavage  Signed, Robyn Haber, MD

## 2013-11-12 ENCOUNTER — Other Ambulatory Visit: Payer: Self-pay | Admitting: Physician Assistant

## 2013-12-23 ENCOUNTER — Encounter: Payer: Self-pay | Admitting: Family Medicine

## 2013-12-23 ENCOUNTER — Ambulatory Visit (INDEPENDENT_AMBULATORY_CARE_PROVIDER_SITE_OTHER): Payer: Medicare Other | Admitting: Family Medicine

## 2013-12-23 VITALS — BP 120/70 | HR 71 | Temp 97.5°F | Resp 16 | Ht 72.5 in | Wt 165.0 lb

## 2013-12-23 DIAGNOSIS — J449 Chronic obstructive pulmonary disease, unspecified: Secondary | ICD-10-CM

## 2013-12-23 DIAGNOSIS — K219 Gastro-esophageal reflux disease without esophagitis: Secondary | ICD-10-CM

## 2013-12-23 DIAGNOSIS — H905 Unspecified sensorineural hearing loss: Secondary | ICD-10-CM

## 2013-12-23 DIAGNOSIS — J45909 Unspecified asthma, uncomplicated: Secondary | ICD-10-CM

## 2013-12-23 DIAGNOSIS — H6120 Impacted cerumen, unspecified ear: Secondary | ICD-10-CM

## 2013-12-23 DIAGNOSIS — J4489 Other specified chronic obstructive pulmonary disease: Secondary | ICD-10-CM

## 2013-12-23 NOTE — Progress Notes (Signed)
° °  Subjective:  This chart was scribed for Robyn Haber, MD by Donato Schultz, Medical Scribe. This patient was seen in Room 24 and the patient's care was started at 12:40 PM.    Patient ID: Bradley Ramirez, male    DOB: 07/28/1941, 73 y.o.   MRN: [id number]  HPI HPI Comments: Bradley Ramirez is a 73 y.o. male who presents to the [facility] for a follow-up appointment.  He states that his cough has improved but he still needs some clarification on how to use his inhaler.  He states that he has stopped using Protonix and has noticed an improvement in his health.  The patient states that he does not need a refill for the inhaler.  He states that he is still working part-time at [employer].  Wife is also working part-time   Past Medical History  Diagnosis Date   Hiatal hernia    GERD (gastroesophageal reflux disease)    Hypertension    Esophageal spasm    Dysphagia, unspecified(787.20)    Esophageal stricture    Chest pain    Murmur, diastolic    Deafness    S/P colonoscopy    Anxiety    Past Surgical History  Procedure Laterality Date   Tonsillectomy     Family History  Problem Relation Age of Onset   Uterine cancer Mother    Cancer Mother    Diabetes Maternal Grandfather    Diabetes Maternal Grandmother    Diabetes Maternal Uncle    Colon cancer Neg Hx    History   Social History   Marital Status: Married    Spouse Name: N/A    Number of Children: 4   Years of Education: N/A   Occupational History   retired        Social History Main Topics   Smoking status: Former Smoker    Types: Cigarettes    Quit date: 09/23/1996   Smokeless tobacco: Never Used   Alcohol Use: No     Comment: quit 1987   Drug Use: No   Sexual Activity: Not on file   Other Topics Concern   Not on file   Social History Narrative   Married   Works as custodian   Education:  High school   No Known Allergies  Review of Systems    Respiratory: Negative for cough and shortness of breath.   All other systems reviewed and are negative.     Objective:  Physical Exam  Nursing note and vitals reviewed. Constitutional: He is oriented to person, place, and time. He appears well-developed and well-nourished.  HENT:  Head: Normocephalic and atraumatic.  Eyes: EOM are normal.  Neck: Normal range of motion.  Cardiovascular: Normal rate.   Pulmonary/Chest: Effort normal.  Musculoskeletal: Normal range of motion.  Neurological: He is alert and oriented to person, place, and time.  Skin: Skin is warm and dry.  Psychiatric: He has a normal mood and affect. His behavior is normal.    Ears are clear   BP 120/70   Pulse 71   Temp(Src) 97.5 F (36.4 C) (Oral)   Resp 16   Ht 6' 0.5" (1.842 m)   Wt 165 lb (74.844 kg)   BMI 22.06 kg/m2   SpO2 98% Assessment & Plan:    I personally performed the services described in this documentation, which was scribed in my presence. The recorded information has been reviewed and is accurate.  Continue present medications Signed, Rise Mu.D.

## 2014-04-07 ENCOUNTER — Ambulatory Visit: Payer: Medicare Other | Admitting: Family Medicine

## 2014-05-03 ENCOUNTER — Other Ambulatory Visit: Payer: Self-pay | Admitting: Internal Medicine

## 2014-06-10 ENCOUNTER — Ambulatory Visit (INDEPENDENT_AMBULATORY_CARE_PROVIDER_SITE_OTHER): Payer: Medicare Other | Admitting: Cardiology

## 2014-06-10 ENCOUNTER — Encounter: Payer: Self-pay | Admitting: Cardiology

## 2014-06-10 VITALS — BP 120/70 | HR 68 | Ht 74.0 in | Wt 163.0 lb

## 2014-06-10 DIAGNOSIS — I10 Essential (primary) hypertension: Secondary | ICD-10-CM

## 2014-06-10 DIAGNOSIS — I428 Other cardiomyopathies: Secondary | ICD-10-CM

## 2014-06-10 NOTE — Patient Instructions (Addendum)
Your physician recommends that you schedule a follow-up appointment in: one year with Dr. Hochrein  We are ordering an Echo   

## 2014-06-10 NOTE — Progress Notes (Signed)
    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  His ejection fraction has been about 45% although it was listed as 35% last year on echo.  I thought the EF was unchanged over serial echoes.  Over the past year he has done quite well. He denies any chest pressure, neck or arm discomfort. He denies any palpitations, presyncope or syncope. He has had no new shortness of breath, PND or orthopnea. He has had no weight gain or edema. He still is active and works.  He did come off HCTZ because of the low blood pressure.  No Known Allergies  Current Outpatient Prescriptions  Medication Sig Dispense Refill  . aspirin 81 MG tablet Take 81 mg by mouth daily.        Marland Kitchen b complex vitamins tablet Take 1 tablet by mouth daily.        . beclomethasone (QVAR) 80 MCG/ACT inhaler Inhale 2 puffs into the lungs 2 (two) times daily.  1 Inhaler  12  . Cholecalciferol (VITAMIN D3) 3000 UNITS TABS Take 2,000 Units by mouth daily.       Marland Kitchen diltiazem (CARDIZEM CD) 180 MG 24 hr capsule Take 1 capsule (180 mg total) by mouth daily. PATIENT NEEDS OFFICE VISIT FOR ADDITIONAL REFILLS  30 capsule  0   No current facility-administered medications for this visit.    Past Medical History  Diagnosis Date  . Hiatal hernia   . GERD (gastroesophageal reflux disease)   . Hypertension   . Esophageal spasm   . Dysphagia, unspecified(787.20)   . Esophageal stricture   . Chest pain   . Murmur, diastolic   . Deafness   . S/P colonoscopy   . Anxiety     Past Surgical History  Procedure Laterality Date  . Tonsillectomy      ROS:  As stated in the HPI and negative for all other systems.  PHYSICAL EXAM BP 120/70  Pulse 68  Ht 6\' 2"  (1.88 m)  Wt 163 lb (73.936 kg)  BMI 20.92 kg/m2 GENERAL:  Well appearing HEENT:  Pupils equal round and reactive, fundi not visualized, oral mucosa unremarkable, dentures NECK:  No jugular venous distention, waveform within normal limits, carotid upstroke  brisk and symmetric, no bruits, no thyromegaly LYMPHATICS:  No cervical, inguinal adenopathy LUNGS:  Clear to auscultation bilaterally BACK:  No CVA tenderness CHEST:  Unremarkable HEART:  PMI not displaced or sustained,S1 and S2 within normal limits, no S3, no S4, no clicks, no rubs, soft upper left sternal border diastolic murmur ABD:  Flat, positive bowel sounds normal in frequency in pitch, no bruits, no rebound, no guarding, no midline pulsatile mass, no hepatomegaly, no splenomegaly EXT:  2 plus pulses throughout, no edema, no cyanosis no clubbing SKIN:  No rashes no nodules NEURO:  Cranial nerves II through XII grossly intact, motor grossly intact throughout PSYCH:  Cognitively intact, oriented to person place and time  EKG:  Sinus rhythm with sinus arrhythmia, rate 68, axis within normal limits, intervals within normal limits,no acute ST-T wave changes.  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  I will repeat this this year.    HYPERTENSION, UNSPECIFIED -  The blood pressure is at target. It was actually too low on HCTZ and this has been discontinued.  He will continue with the meds as listed.

## 2014-06-15 ENCOUNTER — Ambulatory Visit (HOSPITAL_COMMUNITY)
Admission: RE | Admit: 2014-06-15 | Discharge: 2014-06-15 | Disposition: A | Discharge status: Home / Self Care | Payer: Medicare Other | Source: Ambulatory Visit | Attending: Cardiology | Admitting: Cardiology

## 2014-06-15 DIAGNOSIS — I428 Other cardiomyopathies: Secondary | ICD-10-CM | POA: Insufficient documentation

## 2014-06-15 DIAGNOSIS — I10 Essential (primary) hypertension: Secondary | ICD-10-CM | POA: Diagnosis not present

## 2014-06-15 DIAGNOSIS — I369 Nonrheumatic tricuspid valve disorder, unspecified: Secondary | ICD-10-CM

## 2014-06-15 NOTE — Progress Notes (Signed)
2D Echocardiogram Complete.  06/15/2014   Katheen Aslin, RDCS

## 2014-06-19 ENCOUNTER — Other Ambulatory Visit: Payer: Self-pay | Admitting: Family Medicine

## 2014-06-30 ENCOUNTER — Ambulatory Visit: Payer: Medicare Other | Admitting: Family Medicine

## 2014-07-11 ENCOUNTER — Encounter: Payer: Self-pay | Admitting: Family Medicine

## 2014-07-11 ENCOUNTER — Ambulatory Visit (INDEPENDENT_AMBULATORY_CARE_PROVIDER_SITE_OTHER): Payer: Medicare Other | Admitting: Family Medicine

## 2014-07-11 VITALS — BP 132/79 | HR 71 | Temp 97.4°F | Resp 16 | Ht 74.0 in | Wt 164.0 lb

## 2014-07-11 DIAGNOSIS — E559 Vitamin D deficiency, unspecified: Secondary | ICD-10-CM

## 2014-07-11 DIAGNOSIS — I10 Essential (primary) hypertension: Secondary | ICD-10-CM

## 2014-07-11 DIAGNOSIS — Z23 Encounter for immunization: Secondary | ICD-10-CM

## 2014-07-11 DIAGNOSIS — J4531 Mild persistent asthma with (acute) exacerbation: Secondary | ICD-10-CM

## 2014-07-11 MED ORDER — DILTIAZEM HCL ER COATED BEADS 180 MG PO CP24: ORAL_CAPSULE | ORAL | Status: DC

## 2014-07-11 MED ORDER — PNEUMOCOCCAL 13-VAL CONJ VACC IM SUSP: 0.5000 mL | INTRAMUSCULAR | Status: DC

## 2014-07-11 MED ORDER — BECLOMETHASONE DIPROPIONATE 80 MCG/ACT IN AERS: 2.0000 | INHALATION_SPRAY | Freq: Two times a day (BID) | RESPIRATORY_TRACT | Status: DC

## 2014-07-11 NOTE — Progress Notes (Signed)
Patient ID: Bradley Ramirez MRN: [id number], DOB: 10-07-1940, 73 y.o. Date of Encounter: 07/11/2014, 11:53 AM  Primary Physician: Kennon Portela, MD  Chief Complaint: HTN  Pt is Deaf, and an interpretor was used.  HPI: 73 y.o. year old male with history below presents for hypertension follow up. Pt states he is feeling well. Pt received a flu shot last week. Pt states he went on a cruise to [place] last week, and had a great trip, no sea sickness. Pt states he needs refills on his medication. Pt states he needs a refill on his HTN medication, and does not need one for his asthma medication. Pt would like a pneumonia vaccine. Pt has received a colonoscopy, but thinks he is due for one next year.   Pt works at [employer]. Pt works five days a week, about 25 hours per week.  Diet consists of No CP, HA, visual changes, or focal deficits.   Past Medical History  Diagnosis Date  . Hiatal hernia   . GERD (gastroesophageal reflux disease)   . Hypertension   . Esophageal spasm   . Dysphagia, unspecified(787.20)   . Esophageal stricture   . Chest pain   . Murmur, diastolic   . Deafness   . S/P colonoscopy   . Anxiety      Home Meds: Prior to Admission medications   Medication Sig Start Date End Date Taking? Authorizing Provider  aspirin 81 MG tablet Take 81 mg by mouth daily.     Yes Historical Provider, MD  b complex vitamins tablet Take 1 tablet by mouth daily.     Yes Historical Provider, MD  beclomethasone (QVAR) 80 MCG/ACT inhaler Inhale 2 puffs into the lungs 2 (two) times daily. 10/28/13  Yes Robyn Haber, MD  Cholecalciferol (VITAMIN D3) 3000 UNITS TABS Take 2,000 Units by mouth daily.    Yes Historical Provider, MD  diltiazem (CARDIZEM CD) 180 MG 24 hr capsule TAKE 1 CAPSULE (180 MG TOTAL) BY MOUTH DAILY. PATIENT NEEDS OFFICE VISIT FOR ADDITIONAL REFILLS 06/20/14  Yes Robyn Haber, MD    Allergies: No Known Allergies  History   Social History  . Marital Status:  Married    Spouse Name: N/A    Number of Children: 4  . Years of Education: N/A   Occupational History  . retired   .     Social History Main Topics  . Smoking status: Former Smoker    Types: Cigarettes    Quit date: 09/23/1996  . Smokeless tobacco: Never Used  . Alcohol Use: No     Comment: quit 1987  . Drug Use: No  . Sexual Activity: Not on file   Other Topics Concern  . Not on file   Social History Narrative   Married   Works as custodian   Education:  High school     Family History  Problem Relation Age of Onset  . Uterine cancer Mother   . Cancer Mother   . Diabetes Maternal Grandfather   . Diabetes Maternal Grandmother   . Diabetes Maternal Uncle   . Colon cancer Neg Hx     Review of Systems: Constitutional: negative for chills, fever, night sweats, weight changes, or fatigue  HEENT: negative for vision changes, hearing loss, congestion, rhinorrhea, ST, epistaxis, or sinus pressure Cardiovascular: negative for chest pain, palpitations, or DOE Respiratory: negative for hemoptysis, wheezing, shortness of breath, or cough Abdominal: negative for abdominal pain, nausea, vomiting, diarrhea, or constipation Dermatological: negative for rash Neurologic:  negative for headache, dizziness, or syncope All other systems reviewed and are otherwise negative with the exception to those above and in the HPI.   Physical Exam: Blood pressure 132/79, pulse 71, temperature 97.4 F (36.3 C), resp. rate 16, height 6\' 2"  (1.88 m), weight 164 lb (74.39 kg), SpO2 97.00%., Body mass index is 21.05 kg/(m^2). BP Readings from Last 3 Encounters:  07/11/14 132/79  06/10/14 120/70  12/23/13 120/70   Wt Readings from Last 3 Encounters:  07/11/14 164 lb (74.39 kg)  06/10/14 163 lb (73.936 kg)  12/23/13 165 lb (74.844 kg)   General: Well developed, well nourished, in no acute distress. Head: Normocephalic, atraumatic, eyes without discharge, sclera non-icteric, nares are without  discharge. Bilateral auditory canals clear, TM's are without perforation, pearly grey and translucent with reflective cone of light bilaterally. Oral cavity moist, posterior pharynx without exudate, erythema, peritonsillar abscess, or post nasal drip.  Neck: Supple. No thyromegaly. Full ROM. No lymphadenopathy. No carotid bruits. Lungs: Clear bilaterally to auscultation without wheezes, rales, or rhonchi. Breathing is unlabored. Heart: RRR with S1 S2. No murmurs, rubs, or gallops appreciated.  Abdomen: Soft, non-tender, non-distended with normoactive bowel sounds. No hepatosplenomegaly. No rebound/guarding. No obvious abdominal masses. Msk:  Strength and tone normal for age. Extremities/Skin: Warm and dry. No clubbing or cyanosis. No edema. No rashes or suspicious lesions. Distal pulses 2+ and equal bilaterally. Neuro: Alert and oriented X 3. Moves all extremities spontaneously. Gait is normal. CNII-XII grossly in tact. DTR 2+, cerebellar function intact. Rhomberg normal. Psych:  Responds to questions appropriately with a normal affect.   Labs: Lab Results  Component Value Date   HGBA1C  Value: 5.9 (NOTE) The ADA recommends the following therapeutic goal for glycemic control related to Hgb A1c measurement: Goal of therapy: <6.5 Hgb A1c  Reference: American Diabetes Association: Clinical Practice Recommendations 2010, Diabetes Care, 2010, 33: (Suppl  1). 03/23/2009      ASSESSMENT AND PLAN:  73 y.o. year old male with Essential hypertension - Plan: diltiazem (CARDIZEM CD) 180 MG 24 hr capsule  Asthma with acute exacerbation, mild persistent - Plan: beclomethasone (QVAR) 80 MCG/ACT inhaler, pneumococcal 13-valent conjugate vaccine (PREVNAR 13) injection 0.5 mL  Vitamin D deficiency - Plan: Vitamin D, 25-hydroxy   -  Signed, Robyn Haber, MD 07/11/2014 11:53 AM

## 2014-07-11 NOTE — Patient Instructions (Signed)
Pneumococcal Conjugate Vaccine: What You Need to Know  Your doctor recommends that you, or your child, get a dose of PCV13 today.  1. Why get vaccinated?  Pneumococcal conjugate vaccine (called PCV13 or Prevnar 13) is recommended to protect infants and toddlers, and some older children and adults with certain health conditions, from pneumococcal disease.  Pneumococcal disease is caused by infection with Streptococcus pneumoniae bacteria. These bacteria can spread from person to person through close contact.  Pneumococcal disease can lead to severe health problems, including pneumonia, blood infections, and meningitis.  Meningitis is an infection of the covering of the brain. Pneumococcal meningitis is fairly rare (less than 1 case per 100,000 people each year), but it leads to other health problems, including deafness and brain damage. In children, it is fatal in about 1 case out of 10.  Children younger than two are at higher risk for serious disease than older children.  People with certain medical conditions, people over age 65, and cigarette smokers are also at higher risk.  Before vaccine, pneumococcal infections caused many problems each year in the [place] in children younger than 5, including:  · more than 700 cases of meningitis,  · 13,000 blood infections,  · about 5 million ear infections, and  · about 200 deaths.  About 4,000 adults still die each year because of pneumococcal infections.  Pneumococcal infections can be hard to treat because some strains are resistant to antibiotics. This makes prevention through vaccination even more important.  2. PCV13 vaccine  There are more than 90 types of pneumococcal bacteria. PCV13 protects against 13 of them. These 13 strains cause most severe infections in children and about half of infections in adults.   PCV13 is routinely given to children at 2, 4, 6, and 12-15 months of age. Children in this age range are at greatest risk for serious diseases caused  by pneumococcal infection.  PCV13 vaccine may also be recommended for some older children or adults. Your doctor can give you details.  A second type of pneumococcal vaccine, called PPSV23, may also be given to some children and adults, including anyone over age 65. There is a separate Vaccine Information Statement for this vaccine.  3. Precautions   Anyone who has ever had a life-threatening allergic reaction to a dose of this vaccine, to an earlier pneumococcal vaccine called PCV7 (or Prevnar), or to any vaccine containing diphtheria toxoid (for example, DTaP), should not get PCV13.  Anyone with a severe allergy to any component of PCV13 should not get the vaccine. Tell your doctor if the person being vaccinated has any severe allergies.  If the person scheduled for vaccination is sick, your doctor might decide to reschedule the shot on another day.  Your doctor can give you more information about any of these precautions.  4. What are the risks of PCV13 vaccine?   With any medicine, including vaccines, there is a chance of side effects. These are usually mild and go away on their own, but serious reactions are also possible.  Reported problems associated with PCV13 vary by dose and age, but generally:  · About half of children became drowsy after the shot, had a temporary loss of appetite, or had redness or tenderness where the shot was given.  · About 1 out of 3 had swelling where the shot was given.  · About 1 out of 3 had a mild fever, and about 1 in 20 had a higher fever (over 102.2°F).  ·   Up to about 8 out of 10 became fussy or irritable.  Adults receiving the vaccine have reported redness, pain, and swelling where the shot was given. Mild fever, fatigue, headache, chills, or muscle pain have also been reported.  Life-threatening allergic reactions from any vaccine are very rare.  5. What if there is a serious reaction?  What should I look for?  · Look for anything that concerns you, such as signs of a  severe allergic reaction, very high fever, or behavior changes.  Signs of a severe allergic reaction can include hives, swelling of the face and throat, difficulty breathing, a fast heartbeat, dizziness, and weakness. These would start a few minutes to a few hours after the vaccination.  What should I do?  · If you think it is a severe allergic reaction or other emergency that can't wait, call 9-1-1 or get the person to the nearest hospital. Otherwise, call your doctor.  · Afterward, the reaction should be reported to the Vaccine Adverse Event Reporting System (VAERS). Your doctor might file this report, or you can do it yourself through the VAERS web site at www.vaers.hhs.gov, or by calling 1-800-822-7967.  VAERS is only for reporting reactions. They do not give medical advice.  6. The National Vaccine Injury Compensation Program  The National Vaccine Injury Compensation Program (VICP) is a federal program that was created to compensate people who may have been injured by certain vaccines.  Persons who believe they may have been injured by a vaccine can learn about the program and about filing a claim by calling 1-800-338-2382 or visiting the VICP website at www.hrsa.gov/vaccinecompensation.  7. How can I learn more?  · Ask your doctor.  · Call your local or state health department.  · Contact the Centers for Disease Control and Prevention (CDC):  ¨ Call 1-800-232-4636 (1-800-CDC-INFO) or  ¨ Visit CDC's website at www.cdc.gov/vaccines  CDC PCV13 Vaccine VIS (Interim) (11/20/11)  Document Released: 07/07/2006 Document Revised: 01/24/2014 Document Reviewed: 10/29/2013  ExitCare® Patient Information ©2015 [employer]. This information is not intended to replace advice given to you by your health care provider. Make sure you discuss any questions you have with your health care provider.

## 2014-07-12 LAB — VITAMIN D 25 HYDROXY (VIT D DEFICIENCY, FRACTURES): Vit D, 25-Hydroxy: 61 ng/mL (ref 30–89)

## 2014-07-25 ENCOUNTER — Other Ambulatory Visit: Payer: Self-pay | Admitting: Family Medicine

## 2015-07-10 ENCOUNTER — Encounter: Payer: Medicare Other | Admitting: Family Medicine

## 2015-07-17 ENCOUNTER — Ambulatory Visit (INDEPENDENT_AMBULATORY_CARE_PROVIDER_SITE_OTHER): Payer: Medicare Other | Admitting: Family Medicine

## 2015-07-17 ENCOUNTER — Encounter: Payer: Self-pay | Admitting: Family Medicine

## 2015-07-17 VITALS — BP 140/76 | HR 74 | Temp 97.8°F | Resp 16 | Ht 72.75 in | Wt 165.6 lb

## 2015-07-17 DIAGNOSIS — Z131 Encounter for screening for diabetes mellitus: Secondary | ICD-10-CM | POA: Diagnosis not present

## 2015-07-17 DIAGNOSIS — H9190 Unspecified hearing loss, unspecified ear: Secondary | ICD-10-CM

## 2015-07-17 DIAGNOSIS — J4520 Mild intermittent asthma, uncomplicated: Secondary | ICD-10-CM | POA: Diagnosis not present

## 2015-07-17 DIAGNOSIS — K219 Gastro-esophageal reflux disease without esophagitis: Secondary | ICD-10-CM

## 2015-07-17 DIAGNOSIS — Z0000 Encounter for general adult medical examination without abnormal findings: Secondary | ICD-10-CM | POA: Diagnosis not present

## 2015-07-17 DIAGNOSIS — Z23 Encounter for immunization: Secondary | ICD-10-CM | POA: Diagnosis not present

## 2015-07-17 DIAGNOSIS — I10 Essential (primary) hypertension: Secondary | ICD-10-CM

## 2015-07-17 DIAGNOSIS — I429 Cardiomyopathy, unspecified: Secondary | ICD-10-CM | POA: Diagnosis not present

## 2015-07-17 DIAGNOSIS — Z13220 Encounter for screening for lipoid disorders: Secondary | ICD-10-CM | POA: Diagnosis not present

## 2015-07-17 LAB — COMPLETE METABOLIC PANEL WITH GFR
ALT: 9 U/L (ref 9–46)
AST: 16 U/L (ref 10–35)
Albumin: 3.8 g/dL (ref 3.6–5.1)
Alkaline Phosphatase: 69 U/L (ref 40–115)
BILIRUBIN TOTAL: 0.4 mg/dL (ref 0.2–1.2)
BUN: 22 mg/dL (ref 7–25)
CHLORIDE: 107 mmol/L (ref 98–110)
CO2: 25 mmol/L (ref 20–31)
CREATININE: 1.3 mg/dL — AB (ref 0.70–1.18)
Calcium: 8.8 mg/dL (ref 8.6–10.3)
GFR, EST AFRICAN AMERICAN: 62 mL/min (ref >=60)
GFR, Est Non African American: 53 mL/min — ABNORMAL LOW (ref >=60)
GLUCOSE: 89 mg/dL (ref 65–99)
Potassium: 4.1 mmol/L (ref 3.5–5.3)
SODIUM: 141 mmol/L (ref 135–146)
TOTAL PROTEIN: 6.7 g/dL (ref 6.1–8.1)

## 2015-07-17 LAB — LIPID PANEL
Cholesterol: 165 mg/dL (ref 125–200)
HDL: 56 mg/dL (ref >=40)
LDL CALC: 96 mg/dL (ref <130)
TRIGLYCERIDES: 64 mg/dL (ref <150)
Total CHOL/HDL Ratio: 2.9 Ratio (ref <=5.0)
VLDL: 13 mg/dL (ref <30)

## 2015-07-17 MED ORDER — PANTOPRAZOLE SODIUM 40 MG PO TBEC: 40.0000 mg | DELAYED_RELEASE_TABLET | Freq: Every day | ORAL | Status: DC | PRN

## 2015-07-17 NOTE — Patient Instructions (Addendum)
Appointment is scheduled for Wednesday 07/19/15 at 8:15am with Dr. Warren Lacy  Address: [address], [place], [place] [address]  Phone: [phone]  It was nice meeting you today.  You should receive a call or letter about your lab results within the next week to 10 days.  2nd pneumonia vaccine was given today, as well as flu shot.  See the advanced directive paperwork and provide [place] a copy when you have decided.   Try the protonix once every other day or up to daily if needed.  If still coughing up substance or heartburn persists - return for recheck.  No other change in medications for now.   Keeping you healthy  Get these tests  Blood pressure- Have your blood pressure checked once a year by your healthcare provider.  Normal blood pressure is 120/80  Weight- Have your body mass index (BMI) calculated to screen for obesity.  BMI is a measure of body fat based on height and weight. You can also calculate your own BMI at ViewBanking.[email or web address].  Cholesterol- Have your cholesterol checked every year.  Diabetes- Have your blood sugar checked regularly if you have high blood pressure, high cholesterol, have a family history of diabetes or if you are overweight.  Screening for Colon Cancer- Colonoscopy starting at age 51.  Screening may begin sooner depending on your family history and other health conditions. Follow up colonoscopy as directed by your Gastroenterologist.  Screening for Prostate Cancer- Both blood work (PSA) and a rectal exam help screen for Prostate Cancer.  Screening begins at age 20 with African-American men and at age 86 with Caucasian men.  Screening may begin sooner depending on your family history.  Take these medicines  Aspirin- One aspirin daily can help prevent Heart disease and Stroke.  Flu shot- Every fall.  Tetanus- Every 10 years.  Zostavax- Once after the age of 52 to prevent Shingles.  Pneumonia shot- Once after the age of 56; if you  are younger than 55, ask your healthcare provider if you need a Pneumonia shot.  Take these steps  Don't smoke- If you do smoke, talk to your doctor about quitting.  For tips on how to quit, go to www.smokefree.gov or call 1-800-QUIT-NOW.  Be physically active- Exercise 5 days a week for at least 30 minutes.  If you are not already physically active start slow and gradually work up to 30 minutes of moderate physical activity.  Examples of moderate activity include walking briskly, mowing the yard, dancing, swimming, bicycling, etc.  Eat a healthy diet- Eat a variety of healthy food such as fruits, vegetables, low fat milk, low fat cheese, yogurt, lean meant, poultry, fish, beans, tofu, etc. For more information go to www.thenutritionsource.org  Drink alcohol in moderation- Limit alcohol intake to less than two drinks a day. Never drink and drive.  Dentist- Brush and floss twice daily; visit your dentist twice a year.  Depression- Your emotional health is as important as your physical health. If you're feeling down, or losing interest in things you would normally enjoy please talk to your healthcare provider.  Eye exam- Visit your eye doctor every year.  Safe sex- If you may be exposed to a sexually transmitted infection, use a condom.  Seat belts- Seat belts can save your life; always wear one.  Smoke/Carbon Monoxide detectors- These detectors need to be installed on the appropriate level of your home.  Replace batteries at least once a year.  Skin cancer- When out in the  sun, cover up and use sunscreen 15 SPF or higher.  Violence- If anyone is threatening you, please tell your healthcare provider.  Living Will/ Health care power of attorney- Speak with your healthcare provider and family.

## 2015-07-17 NOTE — Progress Notes (Addendum)
Subjective:  This chart was scribed for Bradley Ray, MD by Bradley Ramirez, ED Scribe. This patient was seen in room 21 and the patient's care was started at 1:51 PM.   Patient ID: Bradley Ramirez, male    DOB: [birth year], 74 y.o.   MRN: [id number]  HPI . Chief Complaint  Patient presents with  . Annual Exam    PER NO REFILLS NEEDED   HPI Comments:  Pt is deaf. Interpreter in room   Bradley Ramirez is a 74 y.o. male who presents to the [facility] for a physical. Previous patient of Dr. Carlean Ramirez last ov 1 year ago. Hx of HTN, asthma, deafness, GERD with dysphagia and esophageal stricture.    Cancer screening  Colon cancer- colonoscopy- 2008, normal. Of note he did have endoscopy by Bradley Ramirez in 2010, normal.  Prostate cancer- pt agrees to have this checked today. He unaware of family hx of prostate cancer.   Lab Results  Component Value Date   PSA 2.29 07/20/2012   Immunization Immunization History  Administered Date(s) Administered  . Influenza Split 07/20/2012, 07/02/2014  . Influenza,inj,Quad PF,36+ Mos 08/02/2013  . Pneumococcal Conjugate-13 07/11/2014   Will receive flu shot today.  Tetanus -2009 Prevnar- 06/2014  Unsure if he has received pneumovax. Agrees to pneumovax today  Depression Depression screen [facility] 2/9 07/17/2015 12/23/2013  Decreased Interest 0 0  Down, Depressed, Hopeless 0 0  PHQ - 2 Score 0 0   Fall screening Denies falls within the last year.    Vision   Visual Acuity Screening   Right eye Left eye Both eyes  Without correction:     With correction: 20/30 20/40 20/30    Is seen by Optho once a year.  Dentist  It been a while since he's been seen by a dentist. He has false dentures, both upper and lower.   Advance directives Pt does not have a living will or advance directive yet. Pt and wife plans to have this done.  Hypertension With hx of cardiomyopathy, mild aortic insufficiency last visit with cardiologist in 05/2014.  Last lipid panel 10/2012,  LDL-114 but total 180  Had EKG at that time. EF 50-55% which was low normal no changes therapy.   Lab Results  Component Value Date   CREATININE 1.31 10/28/2013   Pt has not heard from cardiologist. Pt has a wrist cuff at home but does not check BP regularly. Typically pressure is 128-130/77. Pt has acid reflux with green vomit every once in a while. He was told to stop protonix medication.   Low Vitamin D Rechecked last year normal at 72.   Asthma  Uses qvar 80 mcg 2 puffs bid. Pt states asthma is under control. He only uses inhaler as needed.   Pt works part time mowing grass. Like to stay active.   Patient Active Problem List   Diagnosis Date Noted  . Deaf 10/28/2013  . CARDIOMYOPATHY 04/20/2009  . HYPERTENSION, UNSPECIFIED 04/19/2009  . HIATAL HERNIA, HX OF 04/19/2009   Past Medical History  Diagnosis Date  . Hiatal hernia   . GERD (gastroesophageal reflux disease)   . Hypertension   . Esophageal spasm   . Dysphagia, unspecified(787.20)   . Esophageal stricture   . Chest pain   . Murmur, diastolic   . Deafness   . S/P colonoscopy   . Anxiety    Past Surgical History  Procedure Laterality Date  . Tonsillectomy     No Known Allergies  Prior to Admission medications   Medication Sig Start Date End Date Taking? Authorizing Provider  aspirin 81 MG tablet Take 81 mg by mouth daily.      Historical Provider, MD  b complex vitamins tablet Take 1 tablet by mouth daily.      Historical Provider, MD  beclomethasone (QVAR) 80 MCG/ACT inhaler Inhale 2 puffs into the lungs 2 (two) times daily. 07/11/14   Robyn Haber, MD  Cholecalciferol (VITAMIN D3) 3000 UNITS TABS Take 2,000 Units by mouth daily.     Historical Provider, MD  diltiazem (CARDIZEM CD) 180 MG 24 hr capsule TAKE 1 CAPSULE (180 MG TOTAL) BY MOUTH DAILY. 07/11/14   Robyn Haber, MD   Social History   Social History  . Marital Status: Married    Spouse Name: N/A  . Number of  Children: 4  . Years of Education: N/A   Occupational History  . retired   .     Social History Main Topics  . Smoking status: Former Smoker    Types: Cigarettes    Quit date: 09/23/1996  . Smokeless tobacco: Never Used  . Alcohol Use: No     Comment: quit 1987  . Drug Use: No  . Sexual Activity: Not on file   Other Topics Concern  . Not on file   Social History Narrative   Married   Works as custodian   Education:  [employer]   Review of Systems 13 point. No positive responses.    Objective:   Physical Exam  Constitutional: He is oriented to person, place, and time. He appears well-developed and well-nourished. No distress.  HENT:  Head: Normocephalic and atraumatic.  Dentures intact upper and lower  Eyes: Conjunctivae and EOM are normal.  Both eyes with Arcus senilis  Neck: Neck supple.  Cardiovascular: Normal rate.   Pulmonary/Chest: Effort normal.  Abdominal: Soft. There is no tenderness.  Musculoskeletal: Normal range of motion.  Neurological: He is alert and oriented to person, place, and time.  Skin: Skin is warm and dry.  Psychiatric: He has a normal mood and affect. His behavior is normal.  Nursing note and vitals reviewed.   Filed Vitals:   07/17/15 1350  BP: 140/76  Pulse: 74  Temp: 97.8 F (36.6 C)  TempSrc: Oral  Resp: 16  Height: 6' 0.75" (1.848 m)  Weight: 165 lb 9.6 oz (75.116 kg)   Assessment & Plan:   Bradley Ramirez is a 74 y.o. male Medicare annual wellness visit, subsequent  - -anticipatory guidance as below in AVS, screening labs above. Health maintenance items as above in HPI discussed/recommended as applicable.   Essential hypertension - Plan: COMPLETE METABOLIC PANEL WITH GFR, Lipid panel  -stable overall. No changes.   Cardiomyopathy ([place])  -low normal/stable by most recent EF. Scheduled follow up with cardiology.   Deafness, unspecified laterality  -sign language interpreter present. Understanding expressed.    Screening for hyperlipidemia - Plan: Lipid panel  Screening for diabetes mellitus - Plan: COMPLETE METABOLIC PANEL WITH GFR  Asthma, intermittent, uncomplicated  -stable. Has Qvar if needed, but stable.   Gastroesophageal reflux disease, esophagitis presence not specified - Plan: pantoprazole (PROTONIX) 40 MG tablet  - trial of restarting low dose PPI for probable reflux symptoms. Lowest effective dose and every other day if tolerated. RTC precautions and consider GI eval if sx;s persist.   Need for pneumococcal vaccination - Plan: Pneumococcal polysaccharide vaccine 23-valent greater than or equal to 2yo subcutaneous/IM  Influenza vaccine needed -  Plan: Flu Vaccine QUAD 36+ mos IM   Meds ordered this encounter  Medications  . pantoprazole (PROTONIX) 40 MG tablet    Sig: Take 1 tablet (40 mg total) by mouth daily as needed.    Dispense:  30 tablet    Refill:  3   Patient Instructions  Appointment is scheduled for Wednesday 07/19/15 at 8:15am with Dr. Warren Lacy  Address: [address], [place], [place] [address]  Phone: [phone]  It was nice meeting you today.  You should receive a call or letter about your lab results within the next week to 10 days.  2nd pneumonia vaccine was given today, as well as flu shot.  See the advanced directive paperwork and provide [place] a copy when you have decided.   Try the protonix once every other day or up to daily if needed.  If still coughing up substance or heartburn persists - return for recheck.  No other change in medications for now.   Keeping you healthy  Get these tests  Blood pressure- Have your blood pressure checked once a year by your healthcare provider.  Normal blood pressure is 120/80  Weight- Have your body mass index (BMI) calculated to screen for obesity.  BMI is a measure of body fat based on height and weight. You can also calculate your own BMI at ViewBanking.si.  Cholesterol- Have your  cholesterol checked every year.  Diabetes- Have your blood sugar checked regularly if you have high blood pressure, high cholesterol, have a family history of diabetes or if you are overweight.  Screening for Colon Cancer- Colonoscopy starting at age 37.  Screening may begin sooner depending on your family history and other health conditions. Follow up colonoscopy as directed by your Gastroenterologist.  Screening for Prostate Cancer- Both blood work (PSA) and a rectal exam help screen for Prostate Cancer.  Screening begins at age 4 with African-American men and at age 15 with Caucasian men.  Screening may begin sooner depending on your family history.  Take these medicines  Aspirin- One aspirin daily can help prevent Heart disease and Stroke.  Flu shot- Every fall.  Tetanus- Every 10 years.  Zostavax- Once after the age of 43 to prevent Shingles.  Pneumonia shot- Once after the age of 41; if you are younger than 2, ask your healthcare provider if you need a Pneumonia shot.  Take these steps  Don't smoke- If you do smoke, talk to your doctor about quitting.  For tips on how to quit, go to www.smokefree.gov or call 1-800-QUIT-NOW.  Be physically active- Exercise 5 days a week for at least 30 minutes.  If you are not already physically active start slow and gradually work up to 30 minutes of moderate physical activity.  Examples of moderate activity include walking briskly, mowing the yard, dancing, swimming, bicycling, etc.  Eat a healthy diet- Eat a variety of healthy food such as fruits, vegetables, low fat milk, low fat cheese, yogurt, lean meant, poultry, fish, beans, tofu, etc. For more information go to www.thenutritionsource.org  Drink alcohol in moderation- Limit alcohol intake to less than two drinks a day. Never drink and drive.  Dentist- Brush and floss twice daily; visit your dentist twice a year.  Depression- Your emotional health is as important as your physical health.  If you're feeling down, or losing interest in things you would normally enjoy please talk to your healthcare provider.  Eye exam- Visit your eye doctor every year.  Safe sex- If  you may be exposed to a sexually transmitted infection, use a condom.  Seat belts- Seat belts can save your life; always wear one.  Smoke/Carbon Monoxide detectors- These detectors need to be installed on the appropriate level of your home.  Replace batteries at least once a year.  Skin cancer- When out in the sun, cover up and use sunscreen 15 SPF or higher.  Violence- If anyone is threatening you, please tell your healthcare provider.  Living Will/ Health care power of attorney- Speak with your healthcare provider and family.    I personally performed the services described in this documentation, which was scribed in my presence. The recorded information has been reviewed and considered, and addended by me as needed.    By signing my name below, I, Raven Small, attest that this documentation has been prepared under the direction and in the presence of Bradley Ray, MD.  Electronically Signed: Thea Ramirez, ED Scribe. 07/17/2015. 2:37 PM.

## 2015-07-19 ENCOUNTER — Ambulatory Visit: Payer: Self-pay | Admitting: Cardiology

## 2015-07-20 ENCOUNTER — Encounter: Payer: Self-pay | Admitting: Family Medicine

## 2015-08-01 ENCOUNTER — Ambulatory Visit (INDEPENDENT_AMBULATORY_CARE_PROVIDER_SITE_OTHER): Payer: Medicare Other | Admitting: Cardiology

## 2015-08-01 VITALS — BP 128/76 | HR 62 | Ht 74.0 in | Wt 165.2 lb

## 2015-08-01 DIAGNOSIS — I420 Dilated cardiomyopathy: Secondary | ICD-10-CM

## 2015-08-01 NOTE — Patient Instructions (Signed)
Your physician wants you to follow-up in: 1 Year. You will receive a reminder letter in the mail two months in advance. If you don't receive a letter, please call our office to schedule the follow-up appointment.  

## 2015-08-01 NOTE — Progress Notes (Signed)
    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  His ejection fraction was 35% at one time.  Most recently it was 50% with mild AI.  He returns for one year follow up.  Since I last saw her she has done well.  The patient denies any new symptoms such as chest discomfort, neck or arm discomfort. There has been no new shortness of breath, PND or orthopnea. There have been no reported palpitations, presyncope or syncope.  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  No Known Allergies  Current Outpatient Prescriptions  Medication Sig Dispense Refill  . aspirin 81 MG tablet Take 81 mg by mouth daily.      Marland Kitchen diltiazem (CARDIZEM CD) 180 MG 24 hr capsule TAKE 1 CAPSULE (180 MG TOTAL) BY MOUTH DAILY. 90 capsule 3   No current facility-administered medications for this visit.    Past Medical History  Diagnosis Date  . Hiatal hernia   . GERD (gastroesophageal reflux disease)   . Hypertension   . Esophageal spasm   . Dysphagia, unspecified(787.20)   . Esophageal stricture   . Chest pain   . Murmur, diastolic   . Deafness   . S/P colonoscopy   . Anxiety     Past Surgical History  Procedure Laterality Date  . Tonsillectomy      ROS:  As stated in the HPI and negative for all other systems.  PHYSICAL EXAM BP 128/76 mmHg  Pulse 62  Ht 6\' 2"  (1.88 m)  Wt 165 lb 3.2 oz (74.934 kg)  BMI 21.20 kg/m2 GENERAL:  Well appearing HEENT:  Pupils equal round and reactive, fundi not visualized, oral mucosa unremarkable, dentures NECK:  No jugular venous distention, waveform within normal limits, carotid upstroke brisk and symmetric, no bruits, no thyromegaly LUNGS:  Clear to auscultation bilaterally BACK:  No CVA tenderness CHEST:  Unremarkable HEART:  PMI not displaced or sustained,S1 and S2 within normal limits, no S3, no S4, no clicks, no rubs, soft upper left sternal border diastolic murmur ABD:  Flat, positive bowel sounds normal in  frequency in pitch, no bruits, no rebound, no guarding, no midline pulsatile mass, no hepatomegaly, no splenomegaly EXT:  2 plus pulses throughout, no edema, no cyanosis no clubbing   EKG:  Sinus rhythm with sinus arrhythmia, rate 62, axis within normal limits, intervals within normal limits,no acute ST-T wave changes.  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  No change in therapy is indicated.  No further studies are indicated.   HYPERTENSION, UNSPECIFIED -  The blood pressure is at target. No change in therapy is indicated.  AI - I will follow this up with physical exams. There is no indication for echo at this point area

## 2015-08-02 ENCOUNTER — Encounter: Payer: Self-pay | Admitting: Cardiology

## 2015-09-22 ENCOUNTER — Other Ambulatory Visit: Payer: Self-pay | Admitting: Family Medicine

## 2015-10-02 ENCOUNTER — Other Ambulatory Visit: Payer: Self-pay | Admitting: Family Medicine

## 2015-10-02 ENCOUNTER — Telehealth: Payer: Self-pay

## 2015-10-02 NOTE — Telephone Encounter (Addendum)
Pt would like a refill on his diltiazem (CARDIZEM CD) 180 MG 24 hr capsule [id number]. Pharmacy:  [id number] - [place], [place] [address].  I do seeReceipt confirmed by pharmacy (09/22/2015 5:16 PM EST). The pharmacy told him they didn't receive it. They use a deaf interpreter. CB #  [phone]

## 2015-10-03 MED ORDER — DILTIAZEM HCL ER COATED BEADS 180 MG PO CP24: ORAL_CAPSULE | ORAL | Status: DC

## 2015-10-03 NOTE — Telephone Encounter (Signed)
Rx resent.

## 2016-03-23 ENCOUNTER — Other Ambulatory Visit: Payer: Self-pay | Admitting: Family Medicine

## 2016-07-29 ENCOUNTER — Encounter: Payer: Medicare Other | Admitting: Family Medicine

## 2016-07-31 ENCOUNTER — Encounter: Payer: Medicare Other | Admitting: Family Medicine

## 2016-08-01 ENCOUNTER — Encounter: Payer: Medicare Other | Admitting: Family Medicine

## 2016-08-22 NOTE — Progress Notes (Signed)
    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  His ejection fraction was 35% at one time.  Most recently it was 50% with mild AI.  He returns for one year follow up.  Since I last saw him he has done very well.  He still works 4 days a week.  The patient denies any new symptoms such as chest discomfort, neck or arm discomfort. There has been no new shortness of breath, PND or orthopnea. There have been no reported palpitations, presyncope or syncope.  No Known Allergies  Current Outpatient Prescriptions  Medication Sig Dispense Refill  . aspirin 81 MG tablet Take 81 mg by mouth daily.      Marland Kitchen diltiazem (CARDIZEM CD) 180 MG 24 hr capsule TAKE 1 CAPSULE (180 MG TOTAL) BY MOUTH DAILY. 90 capsule 0   No current facility-administered medications for this visit.     Past Medical History:  Diagnosis Date  . Anxiety   . Chest pain   . Deafness   . Dysphagia, unspecified(787.20)   . Esophageal spasm   . Esophageal stricture   . GERD (gastroesophageal reflux disease)   . Hiatal hernia   . Hypertension   . Murmur, diastolic   . S/P colonoscopy     Past Surgical History:  Procedure Laterality Date  . TONSILLECTOMY      ROS:     As stated in the HPI and negative for all other systems.  PHYSICAL EXAM BP 126/90 (BP Location: Left Arm, Patient Position: Sitting, Cuff Size: Normal)   Pulse 62   Ht 6\' 2"  (1.88 m)   Wt 169 lb 2 oz (76.7 kg)   BMI 21.71 kg/m  GENERAL:  Well appearing HEENT:  Pupils equal round and reactive, fundi not visualized, oral mucosa unremarkable, dentures NECK:  No jugular venous distention, waveform within normal limits, carotid upstroke brisk and symmetric, no bruits, no thyromegaly LUNGS:  Clear to auscultation bilaterally BACK:  No CVA tenderness CHEST:  Unremarkable HEART:  PMI not displaced or sustained,S1 and S2 within normal limits, no S3, no S4, no clicks, no rubs, soft upper left sternal border diastolic  murmur ABD:  Flat, positive bowel sounds normal in frequency in pitch, no bruits, no rebound, no guarding, no midline pulsatile mass, no hepatomegaly, no splenomegaly EXT:  2 plus pulses throughout, no edema, no cyanosis no clubbing   EKG:  Sinus rhythm with sinus arrhythmia, rate 63, axis within normal limits, intervals within normal limits,no acute ST-T wave changes.  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  No change in therapy is indicated.  No further studies are indicated.   HYPERTENSION, UNSPECIFIED -  The blood pressure is at target. No change in therapy is indicated.  AI - I will follow this up with physical exams. There is no indication for echo at this point area

## 2016-08-23 ENCOUNTER — Encounter: Payer: Self-pay | Admitting: Cardiology

## 2016-08-23 ENCOUNTER — Ambulatory Visit (INDEPENDENT_AMBULATORY_CARE_PROVIDER_SITE_OTHER): Payer: Medicare Other | Admitting: Cardiology

## 2016-08-23 VITALS — BP 126/90 | HR 62 | Ht 74.0 in | Wt 169.1 lb

## 2016-08-23 DIAGNOSIS — I10 Essential (primary) hypertension: Secondary | ICD-10-CM

## 2016-08-23 DIAGNOSIS — I420 Dilated cardiomyopathy: Secondary | ICD-10-CM

## 2016-08-23 DIAGNOSIS — I351 Nonrheumatic aortic (valve) insufficiency: Secondary | ICD-10-CM

## 2016-08-23 NOTE — Patient Instructions (Signed)

## 2016-08-25 ENCOUNTER — Encounter: Payer: Self-pay | Admitting: Cardiology

## 2016-09-18 ENCOUNTER — Other Ambulatory Visit: Payer: Self-pay | Admitting: Family Medicine

## 2016-09-26 ENCOUNTER — Ambulatory Visit (INDEPENDENT_AMBULATORY_CARE_PROVIDER_SITE_OTHER): Payer: Medicare Other

## 2016-09-26 ENCOUNTER — Encounter: Payer: Self-pay | Admitting: Family Medicine

## 2016-09-26 ENCOUNTER — Ambulatory Visit (INDEPENDENT_AMBULATORY_CARE_PROVIDER_SITE_OTHER): Payer: Medicare Other | Admitting: Family Medicine

## 2016-09-26 VITALS — BP 128/78 | HR 69 | Temp 97.8°F | Resp 16 | Ht 73.0 in | Wt 165.4 lb

## 2016-09-26 DIAGNOSIS — R059 Cough, unspecified: Secondary | ICD-10-CM

## 2016-09-26 DIAGNOSIS — I10 Essential (primary) hypertension: Secondary | ICD-10-CM | POA: Diagnosis not present

## 2016-09-26 DIAGNOSIS — Z0000 Encounter for general adult medical examination without abnormal findings: Secondary | ICD-10-CM

## 2016-09-26 DIAGNOSIS — H9190 Unspecified hearing loss, unspecified ear: Secondary | ICD-10-CM

## 2016-09-26 DIAGNOSIS — J4520 Mild intermittent asthma, uncomplicated: Secondary | ICD-10-CM | POA: Diagnosis not present

## 2016-09-26 DIAGNOSIS — Z13220 Encounter for screening for lipoid disorders: Secondary | ICD-10-CM

## 2016-09-26 DIAGNOSIS — Z1211 Encounter for screening for malignant neoplasm of colon: Secondary | ICD-10-CM

## 2016-09-26 DIAGNOSIS — R05 Cough: Secondary | ICD-10-CM

## 2016-09-26 MED ORDER — BECLOMETHASONE DIPROPIONATE 80 MCG/ACT IN AERS: 1.0000 | INHALATION_SPRAY | Freq: Two times a day (BID) | RESPIRATORY_TRACT | 5 refills | Status: DC

## 2016-09-26 MED ORDER — DILTIAZEM HCL ER COATED BEADS 180 MG PO CP24: ORAL_CAPSULE | ORAL | 3 refills | Status: DC

## 2016-09-26 NOTE — Patient Instructions (Addendum)
Your cough may be due to asthma or possible allergies. I will check a chest x-ray today, but try restarting Qvar 1 puff twice per day. You can also try over-the-counter Claritin in case allergies are causing your cough as well. If cough is not improving in the next 2 weeks, especially if persistent discolored mucous, return to discuss further.  Please review the advanced directives paperwork and let me know if there are questions. If you do complete that paperwork, return to provide copy for [place] to scan into the system.  Keeping you healthy  Get these tests  Blood pressure- Have your blood pressure checked once a year by your healthcare provider.  Normal blood pressure is 120/80  Weight- Have your body mass index (BMI) calculated to screen for obesity.  BMI is a measure of body fat based on height and weight. You can also calculate your own BMI at [email or web address].  Cholesterol- Have your cholesterol checked every year.  Diabetes- Have your blood sugar checked regularly if you have high blood pressure, high cholesterol, have a family history of diabetes or if you are overweight.  Screening for Colon Cancer- Colonoscopy starting at age 85.  Screening may begin sooner depending on your family history and other health conditions. Follow up colonoscopy as directed by your Gastroenterologist.  Screening for Prostate Cancer- Both blood work (PSA) and a rectal exam help screen for Prostate Cancer.  Screening begins at age 42 with African-American men and at age 76 with Caucasian men.  Screening may begin sooner depending on your family history.  Take these medicines  Aspirin- One aspirin daily can help prevent Heart disease and Stroke.  Flu shot- Every fall.  Tetanus- Every 10 years.  Zostavax- Once after the age of 5 to prevent Shingles.  Pneumonia shot- Once after the age of 47; if you are younger than 68, ask your healthcare provider if you need a Pneumonia shot.  Take these  steps  Don't smoke- If you do smoke, talk to your doctor about quitting.  For tips on how to quit, go to www.smokefree.gov or call 1-800-QUIT-NOW.  Be physically active- Exercise 5 days a week for at least 30 minutes.  If you are not already physically active start slow and gradually work up to 30 minutes of moderate physical activity.  Examples of moderate activity include walking briskly, mowing the yard, dancing, swimming, bicycling, etc.  Eat a healthy diet- Eat a variety of healthy food such as fruits, vegetables, low fat milk, low fat cheese, yogurt, lean meant, poultry, fish, beans, tofu, etc. For more information go to www.thenutritionsource.org  Drink alcohol in moderation- Limit alcohol intake to less than two drinks a day. Never drink and drive.  Dentist- Brush and floss twice daily; visit your dentist twice a year.  Depression- Your emotional health is as important as your physical health. If you're feeling down, or losing interest in things you would normally enjoy please talk to your healthcare provider.  Eye exam- Visit your eye doctor every year.  Safe sex- If you may be exposed to a sexually transmitted infection, use a condom.  Seat belts- Seat belts can save your life; always wear one.  Smoke/Carbon Monoxide detectors- These detectors need to be installed on the appropriate level of your home.  Replace batteries at least once a year.  Skin cancer- When out in the sun, cover up and use sunscreen 15 SPF or higher.  Violence- If anyone is threatening you, please tell your healthcare provider.  Living Will/ Health care power of attorney- Speak with your healthcare provider and family.      IF you received an x-ray today, you will receive an invoice from [facility]. Please contact [facility] at [phone] with questions or concerns regarding your invoice.   IF you received labwork today, you will receive an invoice from [facility]. Please contact  [facility] at [phone] with questions or concerns regarding your invoice.   Our billing staff will not be able to assist you with questions regarding bills from these companies.  You will be contacted with the lab results as soon as they are available. The fastest way to get your results is to activate your My Chart account. Instructions are located on the last page of this paperwork. If you have not heard from [place] regarding the results in 2 weeks, please contact this office.

## 2016-09-26 NOTE — Progress Notes (Signed)
By signing my name below I, Tereasa Coop, attest that this documentation has been prepared under the direction and in the presence of Wendie Agreste, MD. Electonically Signed. Tereasa Coop, Scribe 09/26/2016 at 11:39 AM   Subjective:    Patient ID: Bradley Ramirez, male    DOB: October 09, 1940, 76 y.o.   MRN: [id number]  Chief Complaint  Patient presents with  . Annual Exam    HPI Bradley Ramirez is a 76 y.o. male who presents to the [facility] for his annual physical. Pt has hearing loss and has an interpretor present.   Pt reports that he has had a productive cough with green phlegm recently. Cough started a year ago and has been off and on since. Cough has not been everyday. Pt has mild nasal congestion with the cough. Cough worse in the morning. Denies fevers, SOB, wheezing, or unexplained weight loss.   HTN Pt takes 180mg  diltiazem QD. Denies any CP, lightheadedness, dizziness, HA, blurry vision.   History of Cardiomyopathy Pt is followed by Dr Percival Spanish; cardiologist. Last visit 08/23/16. No new symptoms at that time. Most recent EF 50%. No changes, planned for one year follow up. Takes 180mg  diltiazem QD and 81mg  aspirin QD.   Asthma Used Qvar in the past as needed, when discussed at Oct 2016 physical.   GERD Pt also has history of GERD with dysphagia and esophogeal stricture. Denies current problems with heartburn or reflux. Pt is not on any medications for GERD.  Colon Cancer screening Pt's colonoscopy in 2008 was normal. Planned for repeat in 10 years. Performed by Dr Deatra Ina.    Prostate cancer screening  Lab Results  Component Value Date   PSA 2.29 07/20/2012  Denies hematuria, urinary retention, or dysuria.   Immunizations  Immunization History  Administered Date(s) Administered  . Influenza Split 07/20/2012, 07/02/2014  . Influenza,inj,Quad PF,36+ Mos 08/02/2013, 07/17/2015  . Influenza-Unspecified 07/24/2016  . Pneumococcal Conjugate-13  07/11/2014  . Pneumococcal Polysaccharide-23 07/17/2015   Depression screening Depression screen [facility] 2/9 09/26/2016 07/17/2015 12/23/2013  Decreased Interest 0 0 0  Down, Depressed, Hopeless 0 0 0  PHQ - 2 Score 0 0 0   Fall screening No falls in the past year.  Functional status screening Functional Status Survey: Is the patient deaf or have difficulty hearing?: Yes (hearing loss at age 75 yo) Does the patient have difficulty seeing, even when wearing glasses/contacts?: No Does the patient have difficulty concentrating, remembering, or making decisions?: No Does the patient have difficulty walking or climbing stairs?: No Does the patient have difficulty dressing or bathing?: No Does the patient have difficulty doing errands alone such as visiting a doctor's office or shopping?: No  Vision screening  Visual Acuity Screening   Right eye Left eye Both eyes  Without correction:     With correction: 20/30 20/30 20/25   Pt has cataracts and is followed by his eye doctor every year.   Exercise Pt walks all the time because he does not like sitting at home. 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   Dentist Pt has false teeth, upper and lower.  Advanced directives  Discussed in 2016. Did not have at that time.    Pt states that he has not eaten any food today.   Patient Active Problem List   Diagnosis Date Noted  . Deaf 10/28/2013  . CARDIOMYOPATHY 04/20/2009  . HYPERTENSION, UNSPECIFIED 04/19/2009  .  HIATAL HERNIA, HX OF 04/19/2009   Past Medical History:  Diagnosis Date  . Anxiety   . Chest pain   . Deafness   . Dysphagia, unspecified(787.20)   . Esophageal spasm   . Esophageal stricture   . GERD (gastroesophageal reflux disease)   . Hiatal hernia   . Hypertension   . Murmur, diastolic   . S/P colonoscopy    Past Surgical History:  Procedure Laterality Date  . TONSILLECTOMY     No Known  Allergies Prior to Admission medications   Medication Sig Start Date End Date Taking? Authorizing Provider  aspirin 81 MG tablet Take 81 mg by mouth daily.      Historical Provider, MD  diltiazem (CARDIZEM CD) 180 MG 24 hr capsule TAKE 1 CAPSULE (180 MG TOTAL) BY MOUTH DAILY. 03/23/16   Wendie Agreste, MD   Social History   Social History  . Marital status: Married    Spouse name: N/A  . Number of children: 4  . Years of education: N/A   Occupational History  . retired   .  [employer]   Social History Main Topics  . Smoking status: Former Smoker    Types: Cigarettes    Quit date: 09/23/1996  . Smokeless tobacco: Never Used  . Alcohol use No     Comment: quit 1987  . Drug use: No  . Sexual activity: Not on file   Other Topics Concern  . Not on file   Social History Narrative   Married   Works as custodian   Education:  [employer] system ROS reviewed and negative with exception of cough, as described in HPI.     Objective:   Physical Exam  Constitutional: He is oriented to person, place, and time. He appears well-developed and well-nourished.  HENT:  Head: Normocephalic and atraumatic.  Right Ear: External ear normal.  Left Ear: External ear normal.  Mouth/Throat: Oropharynx is clear and moist.  Eyes: Conjunctivae and EOM are normal. Pupils are equal, round, and reactive to light.  Neck: Normal range of motion. Neck supple. No thyromegaly present.  Cardiovascular: Normal rate, regular rhythm and intact distal pulses.   Murmur heard.  Systolic murmur is present with a grade of 2/6  Pulmonary/Chest: Effort normal and breath sounds normal. No respiratory distress. He has no wheezes.  Abdominal: Soft. He exhibits no distension. There is no tenderness.  Musculoskeletal: Normal range of motion. He exhibits no edema or tenderness.       Right knee: He exhibits normal range of motion and no effusion.       Left knee: He exhibits normal range of  motion and no effusion.  Lymphadenopathy:    He has no cervical adenopathy.  Neurological: He is alert and oriented to person, place, and time. He has normal reflexes.  Skin: Skin is warm and dry.  Psychiatric: He has a normal mood and affect. His behavior is normal.  Vitals reviewed.  Vitals:   09/26/16 0949  BP: 128/78  Pulse: 69  Resp: 16  Temp: 97.8 F (36.6 C)  TempSrc: Oral  SpO2: 98%  Weight: 165 lb 6.4 oz (75 kg)  Height: 6\' 1"  (1.854 m)   Dg Chest 2 View  Result Date: 09/26/2016 CLINICAL DATA:  Omitting cough for the past year occasionally productive of green sputum. History of cardiomyopathy, former smoker. EXAM: CHEST  2 VIEW COMPARISON:  PA and lateral chest x-ray of October 28, 2013 FINDINGS: The lungs are mildly hyperinflated with hemidiaphragm flattening. There is no focal infiltrate. There is no pleural effusion. The heart and pulmonary vascularity are normal. The mediastinum is normal in width. There is mild biapical pleural thickening which is stable. The bony thorax is unremarkable. IMPRESSION: Hyperinflation consistent with COPD or reactive airway disease. No pneumonia, CHF, nor other acute cardiopulmonary abnormality. Electronically Signed   By: David  Martinique M.D.   On: 09/26/2016 11:26        Assessment & Plan:   Bradley Ramirez is a 76 y.o. male Medicare annual wellness visit, subsequent - Plan: Care order/instruction:  -  - anticipatory guidance as below in AVS, screening labs if needed. Health maintenance items as above in HPI discussed/recommended as applicable.   - no concerning responses on depression, fall, or functional status screening. Any positive responses noted as above. Advanced directives discussed as in CHL. Chronic hearing loss. Asked if any resources needed with his hearing loss history. None noted at this time.   Screen for colon cancer - Plan: Ambulatory referral to Gastroenterology  Essential hypertension - Plan: Comprehensive metabolic  panel, Lipid panel  - stable. No change in meds for now. Diltiazem refilled same dose.   Cough - Plan: DG Chest 2 View, beclomethasone (QVAR) 80 MCG/ACT inhaler Mild intermittent asthma without complication - Plan: beclomethasone (QVAR) 80 MCG/ACT inhaler  - may be component of asthma or copd by XR.  No infectious signs on XR or exam.Tolerated Qvar prior. PND from allergic rhinitis also possible.   - restart qvar, otc claritin if needed, recheck in next 2 weeks if not improving. Sooner if worse.   Screening for hyperlipidemia  - check lipid panel.   Hearing loss, unspecified hearing loss type, unspecified laterality  - Sign language interpreter present, understanding expressed.   Meds ordered this encounter  Medications  . beclomethasone (QVAR) 80 MCG/ACT inhaler    Sig: Inhale 1 puff into the lungs 2 (two) times daily.    Dispense:  1 Inhaler    Refill:  5  . diltiazem (CARDIZEM CD) 180 MG 24 hr capsule    Sig: TAKE 1 CAPSULE (180 MG TOTAL) BY MOUTH DAILY.    Dispense:  90 capsule    Refill:  3   Patient Instructions   Your cough may be due to asthma or possible allergies. I will check a chest x-ray today, but try restarting Qvar 1 puff twice per day. You can also try over-the-counter Claritin in case allergies are causing your cough as well. If cough is not improving in the next 2 weeks, especially if persistent discolored mucous, return to discuss further.  Please review the advanced directives paperwork and let me know if there are questions. If you do complete that paperwork, return to provide copy for [place] to scan into the system.  Keeping you healthy  Get these tests  Blood pressure- Have your blood pressure checked once a year by your healthcare provider.  Normal blood pressure is 120/80  Weight- Have your body mass index (BMI) calculated to screen for obesity.  BMI is a measure of body fat based on height and weight. You can also calculate your own BMI at  [employer]Bank[employer].  Cholesterol- Have your cholesterol checked every year.  Diabetes- Have your blood sugar checked regularly if you have high blood pressure, high cholesterol, have a family history of diabetes or if you are overweight.  Screening for Colon Cancer- Colonoscopy starting at age 82.  Screening may begin  sooner depending on your family history and other health conditions. Follow up colonoscopy as directed by your Gastroenterologist.  Screening for Prostate Cancer- Both blood work (PSA) and a rectal exam help screen for Prostate Cancer.  Screening begins at age 109 with African-American men and at age 18 with Caucasian men.  Screening may begin sooner depending on your family history.  Take these medicines  Aspirin- One aspirin daily can help prevent Heart disease and Stroke.  Flu shot- Every fall.  Tetanus- Every 10 years.  Zostavax- Once after the age of 64 to prevent Shingles.  Pneumonia shot- Once after the age of 79; if you are younger than 52, ask your healthcare provider if you need a Pneumonia shot.  Take these steps  Don't smoke- If you do smoke, talk to your doctor about quitting.  For tips on how to quit, go to www.smokefree.gov or call 1-800-QUIT-NOW.  Be physically active- Exercise 5 days a week for at least 30 minutes.  If you are not already physically active start slow and gradually work up to 30 minutes of moderate physical activity.  Examples of moderate activity include walking briskly, mowing the yard, dancing, swimming, bicycling, etc.  Eat a healthy diet- Eat a variety of healthy food such as fruits, vegetables, low fat milk, low fat cheese, yogurt, lean meant, poultry, fish, beans, tofu, etc. For more information go to www.thenutritionsource.org  Drink alcohol in moderation- Limit alcohol intake to less than two drinks a day. Never drink and drive.  Dentist- Brush and floss twice daily; visit your dentist twice a year.  Depression- Your  emotional health is as important as your physical health. If you're feeling down, or losing interest in things you would normally enjoy please talk to your healthcare provider.  Eye exam- Visit your eye doctor every year.  Safe sex- If you may be exposed to a sexually transmitted infection, use a condom.  Seat belts- Seat belts can save your life; always wear one.  Smoke/Carbon Monoxide detectors- These detectors need to be installed on the appropriate level of your home.  Replace batteries at least once a year.  Skin cancer- When out in the sun, cover up and use sunscreen 15 SPF or higher.  Violence- If anyone is threatening you, please tell your healthcare provider.  Living Will/ Health care power of attorney- Speak with your healthcare provider and family.      IF you received an x-ray today, you will receive an invoice from [facility]. Please contact [facility] at [phone] with questions or concerns regarding your invoice.   IF you received labwork today, you will receive an invoice from [place]. Please contact [facility] at [phone] with questions or concerns regarding your invoice.   Our billing staff will not be able to assist you with questions regarding bills from these companies.  You will be contacted with the lab results as soon as they are available. The fastest way to get your results is to activate your My Chart account. Instructions are located on the last page of this paperwork. If you have not heard from [place] regarding the results in 2 weeks, please contact this office.       I personally performed the services described in this documentation, which was scribed in my presence. The recorded information has been reviewed and considered, and addended by me as needed.   Signed,   [name], MD [facility].  09/26/16 2:27 PM

## 2016-09-27 ENCOUNTER — Encounter: Payer: Self-pay | Admitting: Gastroenterology

## 2016-09-27 LAB — LIPID PANEL
CHOL/HDL RATIO: 3.5 ratio (ref 0.0–5.0)
Cholesterol, Total: 199 mg/dL (ref 100–199)
HDL: 57 mg/dL (ref >39)
LDL Calculated: 126 mg/dL — ABNORMAL HIGH (ref 0–99)
Triglycerides: 78 mg/dL (ref 0–149)
VLDL Cholesterol Cal: 16 mg/dL (ref 5–40)

## 2016-09-27 LAB — COMPREHENSIVE METABOLIC PANEL
A/G RATIO: 1.4 (ref 1.2–2.2)
ALT: 10 IU/L (ref 0–44)
AST: 18 IU/L (ref 0–40)
Albumin: 4.2 g/dL (ref 3.5–4.8)
Alkaline Phosphatase: 88 IU/L (ref 39–117)
BILIRUBIN TOTAL: 0.3 mg/dL (ref 0.0–1.2)
BUN/Creatinine Ratio: 15 (ref 10–24)
BUN: 20 mg/dL (ref 8–27)
CALCIUM: 9 mg/dL (ref 8.6–10.2)
CHLORIDE: 104 mmol/L (ref 96–106)
CO2: 22 mmol/L (ref 18–29)
Creatinine, Ser: 1.3 mg/dL — ABNORMAL HIGH (ref 0.76–1.27)
GFR, EST AFRICAN AMERICAN: 61 mL/min/{1.73_m2} (ref >59)
GFR, EST NON AFRICAN AMERICAN: 53 mL/min/{1.73_m2} — AB (ref >59)
GLOBULIN, TOTAL: 3 g/dL (ref 1.5–4.5)
Glucose: 97 mg/dL (ref 65–99)
POTASSIUM: 4.4 mmol/L (ref 3.5–5.2)
SODIUM: 142 mmol/L (ref 134–144)
TOTAL PROTEIN: 7.2 g/dL (ref 6.0–8.5)

## 2016-10-01 ENCOUNTER — Telehealth: Payer: Self-pay

## 2016-10-01 NOTE — Telephone Encounter (Signed)
qvar not covered needs PA or change to flovent (first choice to cover)  or arnuity Please advise (send new rx to pharmacy if appropriate)  Key [id number] cover my meds

## 2016-10-02 MED ORDER — FLUTICASONE PROPIONATE HFA 44 MCG/ACT IN AERO: 2.0000 | INHALATION_SPRAY | Freq: Two times a day (BID) | RESPIRATORY_TRACT | 12 refills | Status: DC

## 2016-10-02 NOTE — Telephone Encounter (Signed)
Flovent ordered. Please advise patient for reason of change in medication.

## 2016-10-04 NOTE — Telephone Encounter (Signed)
Left message with sign language with change of medication

## 2016-11-26 ENCOUNTER — Ambulatory Visit (AMBULATORY_SURGERY_CENTER): Payer: Self-pay

## 2016-11-26 ENCOUNTER — Encounter (INDEPENDENT_AMBULATORY_CARE_PROVIDER_SITE_OTHER): Payer: Self-pay

## 2016-11-26 VITALS — Ht 74.0 in | Wt 171.6 lb

## 2016-11-26 DIAGNOSIS — Z1211 Encounter for screening for malignant neoplasm of colon: Secondary | ICD-10-CM

## 2016-11-26 MED ORDER — NA SULFATE-K SULFATE-MG SULF 17.5-3.13-1.6 GM/177ML PO SOLN: 1.0000 | Freq: Once | ORAL | 0 refills | Status: AC

## 2016-11-26 NOTE — Progress Notes (Signed)
Per pt, no allergies to soy or egg products.Pt not taking any weight loss meds or using  O2 at home.  Pt came into the office for his pre-visit today and Gwynneth Munson( a sign interpretor) was with the pt to help with paperwork, instructions and answer questions. Pt will have an interpretor with him on 12/09/16 to help with sign language.

## 2016-11-27 ENCOUNTER — Encounter: Payer: Self-pay | Admitting: Gastroenterology

## 2016-12-09 ENCOUNTER — Ambulatory Visit (AMBULATORY_SURGERY_CENTER): Payer: Medicare Other | Admitting: Gastroenterology

## 2016-12-09 ENCOUNTER — Encounter: Payer: Self-pay | Admitting: Gastroenterology

## 2016-12-09 VITALS — BP 126/88 | HR 58 | Temp 97.7°F | Resp 22 | Ht 73.0 in | Wt 171.0 lb

## 2016-12-09 DIAGNOSIS — K6389 Other specified diseases of intestine: Secondary | ICD-10-CM | POA: Diagnosis not present

## 2016-12-09 DIAGNOSIS — Z1212 Encounter for screening for malignant neoplasm of rectum: Secondary | ICD-10-CM

## 2016-12-09 DIAGNOSIS — D123 Benign neoplasm of transverse colon: Secondary | ICD-10-CM

## 2016-12-09 DIAGNOSIS — Z1211 Encounter for screening for malignant neoplasm of colon: Secondary | ICD-10-CM

## 2016-12-09 DIAGNOSIS — K635 Polyp of colon: Secondary | ICD-10-CM | POA: Diagnosis not present

## 2016-12-09 MED ORDER — SODIUM CHLORIDE 0.9 % IV SOLN: 500.0000 mL | INTRAVENOUS | Status: DC

## 2016-12-09 NOTE — Patient Instructions (Signed)
YOU HAD AN ENDOSCOPIC PROCEDURE TODAY AT [facility]:   Refer to the procedure report that was given to you for any specific questions about what was found during the examination.  If the procedure report does not answer your questions, please call your gastroenterologist to clarify.  If you requested that your care partner not be given the details of your procedure findings, then the procedure report has been included in a sealed envelope for you to review at your convenience later.  YOU SHOULD EXPECT: Some feelings of bloating in the abdomen. Passage of more gas than usual.  Walking can help get rid of the air that was put into your GI tract during the procedure and reduce the bloating. If you had a lower endoscopy (such as a colonoscopy or flexible sigmoidoscopy) you may notice spotting of blood in your stool or on the toilet paper. If you underwent a bowel prep for your procedure, you may not have a normal bowel movement for a few days.  Please Note:  You might notice some irritation and congestion in your nose or some drainage.  This is from the oxygen used during your procedure.  There is no need for concern and it should clear up in a day or so.  SYMPTOMS TO REPORT IMMEDIATELY:   Following lower endoscopy (colonoscopy or flexible sigmoidoscopy):  Excessive amounts of blood in the stool  Significant tenderness or worsening of abdominal pains  Swelling of the abdomen that is new, acute  Fever of 100F or higher  For urgent or emergent issues, a gastroenterologist can be reached at any hour by calling [phone].   DIET:  We do recommend a small meal at first, but then you may proceed to your regular diet.  Drink plenty of fluids but you should avoid alcoholic beverages for 24 hours.  ACTIVITY:  You should plan to take it easy for the rest of today and you should NOT DRIVE or use heavy machinery until tomorrow (because of the sedation medicines used during the test).     FOLLOW UP: Our staff will call the number listed on your records the next business day following your procedure to check on you and address any questions or concerns that you may have regarding the information given to you following your procedure. If we do not reach you, we will leave a message.  However, if you are feeling well and you are not experiencing any problems, there is no need to return our call.  We will assume that you have returned to your regular daily activities without incident.  If any biopsies were taken you will be contacted by phone or by letter within the next 1-3 weeks.  Please call us at [phone] if you have not heard about the biopsies in 3 weeks.   Await for biopsy results to determined if needed repeat Colonoscopy No aspirin, Naproxen, or other non-steriodal anti-inflammatory drugs two weeks after polyps removal Hemorrhoids (handout given) Diverticulosis (handout given) Polyps (handout given)   SIGNATURES/CONFIDENTIALITY: You and/or your care partner have signed paperwork which will be entered into your electronic medical record.  These signatures attest to the fact that that the information above on your After Visit Summary has been reviewed and is understood.  Full responsibility of the confidentiality of this discharge information lies with you and/or your care-partner.

## 2016-12-09 NOTE — Op Note (Signed)
[name] Procedure Date: 12/09/2016 9:57 AM MRN: [id number] Endoscopist: Remo Lipps P. Armbruster MD, MD Age: 76 Referring MD:  Date of Birth: 01/06/1941 Gender: Male Account #: [id number] Procedure:                Colonoscopy Indications:              Screening for colorectal malignant neoplasm Medicines:                Monitored Anesthesia Care Procedure:                Pre-Anesthesia Assessment:                           - Prior to the procedure, a History and Physical                            was performed, and patient medications and                            allergies were reviewed. The patient's tolerance of                            previous anesthesia was also reviewed. The risks                            and benefits of the procedure and the sedation                            options and risks were discussed with the patient.                            All questions were answered, and informed consent                            was obtained. Prior Anticoagulants: The patient has                            taken aspirin, last dose was 1 day prior to                            procedure. ASA Grade Assessment: II - A patient                            with mild systemic disease. After reviewing the                            risks and benefits, the patient was deemed in                            satisfactory condition to undergo the procedure.                           After obtaining informed consent, the colonoscope  was passed under direct vision. Throughout the                            procedure, the patient's blood pressure, pulse, and                            oxygen saturations were monitored continuously. The                            Colonoscope was introduced through the anus and                            advanced to the the cecum, identified by                            appendiceal orifice  and ileocecal valve. The                            colonoscopy was performed without difficulty. The                            patient tolerated the procedure well. The quality                            of the bowel preparation was good. The ileocecal                            valve, appendiceal orifice, and rectum were                            photographed. Scope In: 10:10:46 AM Scope Out: 10:25:23 AM Scope Withdrawal Time: 0 hours 10 minutes 16 seconds  Total Procedure Duration: 0 hours 14 minutes 37 seconds  Findings:                 The perianal and digital rectal examinations were                            normal.                           A 3 mm polyp was found in the transverse colon. The                            polyp was sessile. The polyp was removed with a                            cold snare. Resection and retrieval were complete.                           A few small-mouthed diverticula were found in the                            sigmoid colon.  Internal hemorrhoids were found during                            retroflexion. The hemorrhoids were small.                           The exam was otherwise without abnormality. Complications:            No immediate complications. Estimated blood loss:                            Minimal. Estimated Blood Loss:     Estimated blood loss was minimal. Impression:               - One 3 mm polyp in the transverse colon, removed                            with a cold snare. Resected and retrieved.                           - Diverticulosis in the sigmoid colon.                           - Internal hemorrhoids.                           - The examination was otherwise normal. Recommendation:           - Patient has a contact number available for                            emergencies. The signs and symptoms of potential                            delayed complications were discussed with the                             patient. Return to normal activities tomorrow.                            Written discharge instructions were provided to the                            patient.                           - Resume previous diet.                           - Continue present medications.                           - Await pathology results.                           - Repeat colonoscopy is recommended for  surveillance. The colonoscopy date will be                            determined after pathology results from today's                            exam become available for review.                           - No ibuprofen, naproxen, or other non-steroidal                            anti-inflammatory drugs for 2 weeks after polyp                            removal. Remo Lipps P. Armbruster MD, MD 12/09/2016 10:28:32 AM This report has been signed electronically.

## 2016-12-09 NOTE — Progress Notes (Signed)
Report to PACU, RN, vss, BBS= Clear.  

## 2016-12-09 NOTE — Progress Notes (Signed)
Called to room to assist during endoscopic procedure.  Patient ID and intended procedure confirmed with present staff. Received instructions for my participation in the procedure from the performing physician.  

## 2016-12-10 ENCOUNTER — Telehealth: Payer: Self-pay | Admitting: *Deleted

## 2016-12-10 NOTE — Telephone Encounter (Signed)
  Follow up Call-  Call back number 12/09/2016  Post procedure Call Back phone  # [phone]  Permission to leave phone message Yes  Some recent data might be hidden     Patient questions:  Do you have a fever, pain , or abdominal swelling? No. Pain Score  0 *  Have you tolerated food without any problems? Yes.    Have you been able to return to your normal activities? Yes.    Do you have any questions about your discharge instructions: Diet   No. Medications  No. Follow up visit  No.  Do you have questions or concerns about your Care? No.  Actions: * If pain score is 4 or above: No action needed, pain <4.

## 2016-12-12 ENCOUNTER — Encounter: Payer: Self-pay | Admitting: Gastroenterology

## 2017-09-29 ENCOUNTER — Encounter: Payer: Self-pay | Admitting: Family Medicine

## 2017-09-29 ENCOUNTER — Ambulatory Visit: Payer: Medicare Other

## 2017-09-29 ENCOUNTER — Ambulatory Visit (INDEPENDENT_AMBULATORY_CARE_PROVIDER_SITE_OTHER): Payer: Medicare Other

## 2017-09-29 ENCOUNTER — Other Ambulatory Visit: Payer: Self-pay

## 2017-09-29 ENCOUNTER — Ambulatory Visit (INDEPENDENT_AMBULATORY_CARE_PROVIDER_SITE_OTHER): Payer: Medicare Other | Admitting: Family Medicine

## 2017-09-29 VITALS — BP 130/80 | HR 90 | Resp 16 | Ht 73.0 in | Wt 166.2 lb

## 2017-09-29 VITALS — BP 138/80 | HR 90 | Ht 73.0 in | Wt 166.2 lb

## 2017-09-29 DIAGNOSIS — Z0000 Encounter for general adult medical examination without abnormal findings: Secondary | ICD-10-CM | POA: Diagnosis not present

## 2017-09-29 DIAGNOSIS — J4520 Mild intermittent asthma, uncomplicated: Secondary | ICD-10-CM | POA: Diagnosis not present

## 2017-09-29 DIAGNOSIS — I10 Essential (primary) hypertension: Secondary | ICD-10-CM | POA: Diagnosis not present

## 2017-09-29 DIAGNOSIS — Z8719 Personal history of other diseases of the digestive system: Secondary | ICD-10-CM

## 2017-09-29 MED ORDER — DILTIAZEM HCL ER COATED BEADS 180 MG PO CP24: ORAL_CAPSULE | ORAL | 3 refills | Status: DC

## 2017-09-29 MED ORDER — FLUTICASONE PROPIONATE HFA 44 MCG/ACT IN AERO: 2.0000 | INHALATION_SPRAY | Freq: Two times a day (BID) | RESPIRATORY_TRACT | 12 refills | Status: DC

## 2017-09-29 NOTE — Progress Notes (Signed)
Subjective:   Bradley Ramirez is a 77 y.o. male who presents for Medicare Annual/Subsequent preventive examination.  Review of Systems:  N/A Cardiac Risk Factors include: advanced age (>39men, >65 women);hypertension;male gender     Objective:    Vitals: BP 138/80   Pulse 90   Ht 6\' 1"  (1.854 m)   Wt 166 lb 4 oz (75.4 kg)   SpO2 100%   BMI 21.93 kg/m   Body mass index is 21.93 kg/m.  Advanced Directives 09/29/2017 12/09/2016 09/26/2016  Does Patient Have a Medical Advance Directive? No No No  Would patient like information on creating a medical advance directive? Yes (MAU/Ambulatory/Procedural Areas - Information given) - Yes (MAU/Ambulatory/Procedural Areas - Information given)    Tobacco Social History   Tobacco Use  Smoking Status Former Smoker  . Types: Cigarettes  . Last attempt to quit: 1987  . Years since quitting: 32.0  Smokeless Tobacco Never Used     Counseling given: Not Answered   Clinical Intake:  Pre-visit preparation completed: Yes  Pain : No/denies pain     Nutritional Status: BMI of 19-24  Normal Nutritional Risks: None Diabetes: No  How often do you need to have someone help you when you read instructions, pamphlets, or other written materials from your doctor or pharmacy?: 1 - Never What is the last grade level you completed in school?: Some college  Interpreter Needed?: Yes Interpreter Agency: [facility]  Patient Declined Interpreter : No  Comments: Patient is deaf  Information entered by :: Andrez Grime, LPN   Past Medical History:  Diagnosis Date  . Anxiety   . Chest pain    per pt/ was resolved/ was [place] per pt.  . Deafness    Pt does sign language!  Marland Kitchen Dysphagia, unspecified(787.20)   . Esophageal spasm   . Esophageal stricture   . GERD (gastroesophageal reflux disease)    no meds  . Hiatal hernia   . Hypertension   . Murmur, diastolic 5784   U/S was ok per pt.  . S/P colonoscopy    Past Surgical History:    Procedure Laterality Date  . COLONOSCOPY    . TONSILLECTOMY     as a child  . UPPER GASTROINTESTINAL ENDOSCOPY     Family History  Problem Relation Age of Onset  . Uterine cancer Mother   . Cancer Mother   . Diabetes Maternal Grandfather   . Diabetes Maternal Grandmother   . Diabetes Maternal Uncle   . Uterine cancer Maternal Aunt   . Colon cancer Neg Hx    Social History   Socioeconomic History  . Marital status: Married    Spouse name: None  . Number of children: 4  . Years of education: None  . Highest education level: Some college, no degree  Social Needs  . Financial resource strain: Not hard at all  . Food insecurity - worry: Never true  . Food insecurity - inability: Never true  . Transportation needs - medical: No  . Transportation needs - non-medical: No  Occupational History  . Occupation: retired    Fish farm manager: [employer]  Tobacco Use  . Smoking status: Former Smoker    Types: Cigarettes    Last attempt to quit: 1987    Years since quitting: 32.0  . Smokeless tobacco: Never Used  Substance and Sexual Activity  . Alcohol use: No    Comment: quit 1987  . Drug use: No  . Sexual activity: None  Other Topics Concern  .  None  Social History Narrative   Married   Works as custodian   Education:  High school    Outpatient Encounter Medications as of 09/29/2017  Medication Sig  . aspirin 81 MG tablet Take 81 mg by mouth daily.    Marland Kitchen diltiazem (CARDIZEM CD) 180 MG 24 hr capsule TAKE 1 CAPSULE (180 MG TOTAL) BY MOUTH DAILY.  . fluticasone (FLOVENT HFA) 44 MCG/ACT inhaler Inhale 2 puffs into the lungs 2 (two) times daily.   Facility-Administered Encounter Medications as of 09/29/2017  Medication  . 0.9 %  sodium chloride infusion    Activities of Daily Living In your present state of health, do you have any difficulty performing the following activities: 09/29/2017  Hearing? Y  Comment Patient is deaf   Vision? Y  Comment Patient has blurry vision in his left  eye. Seeing his eye doctor concerning this.   Difficulty concentrating or making decisions? N  Walking or climbing stairs? N  Dressing or bathing? N  Doing errands, shopping? N  Preparing Food and eating ? N  Using the Toilet? N  In the past six months, have you accidently leaked urine? N  Do you have problems with loss of bowel control? N  Managing your Medications? N  Managing your Finances? N  Housekeeping or managing your Housekeeping? N  Some recent data might be hidden    Patient Care Team: Wendie Agreste, MD as PCP - General (Family Medicine) Rutherford Guys, MD as Consulting Physician (Ophthalmology)   Assessment:   This is a routine wellness examination for [name].  Exercise Activities and Dietary recommendations Current Exercise Habits: The patient does not participate in regular exercise at present, Exercise limited by: None identified  Goals    . declined     Patient declined health goal.        Fall Risk Fall Risk  09/29/2017 09/26/2016 07/17/2015 12/23/2013  Falls in the past year? No No No Yes  Comment - - - loss of balance  Number falls in past yr: - - - 1   Is the patient's home free of loose throw rugs in walkways, pet beds, electrical cords, etc?   yes      Grab bars in the bathroom? no      Handrails on the stairs?   yes      Adequate lighting?   yes  Timed Get Up and Go Performed: yes, completed within 30 seconds   Depression Screen PHQ 2/9 Scores 09/29/2017 09/26/2016 07/17/2015 12/23/2013  PHQ - 2 Score 0 0 0 0    Cognitive Function     6CIT Screen 09/29/2017  What Year? 0 points  What month? 0 points  What time? 0 points  Count back from 20 0 points  Months in reverse 0 points  Repeat phrase 2 points  Total Score 2    Immunization History  Administered Date(s) Administered  . Influenza Split 07/20/2012, 07/02/2014  . Influenza,inj,Quad PF,6+ Mos 08/02/2013, 07/17/2015  . Influenza-Unspecified 07/24/2016  . Pneumococcal Conjugate-13  07/11/2014  . Pneumococcal Polysaccharide-23 07/17/2015    Qualifies for Shingles Vaccine? Check with your pharmacy about receiving the Shingrix vaccine   Screening Tests Health Maintenance  Topic Date Due  . TETANUS/TDAP  09/29/2018 (Originally 12/18/1959)  . INFLUENZA VACCINE  Completed  . PNA vac Low Risk Adult  Completed   Cancer Screenings: Lung: Low Dose CT Chest recommended if Age 30-80 years, 30 pack-year currently smoking OR have quit w/in 15years. Patient does not  qualify. Colorectal: colonoscopy completed 12/09/2016  Additional Screenings:  Hepatitis B/HIV/Syphillis: not indicated  Hepatitis C Screening: not indicated     Plan:   I have personally reviewed and noted the following in the patient's chart:   . Medical and social history . Use of alcohol, tobacco or illicit drugs  . Current medications and supplements . Functional ability and status . Nutritional status . Physical activity . Advanced directives . List of other physicians . Hospitalizations, surgeries, and ER visits in previous 12 months . Vitals . Screenings to include cognitive, depression, and falls . Referrals and appointments  In addition, I have reviewed and discussed with patient certain preventive protocols, quality metrics, and best practice recommendations. A written personalized care plan for preventive services as well as general preventive health recommendations were provided to patient.   1. Essential hypertension - Lipid panel - Comprehensive metabolic panel - CBC with Differential/Platelet  2. Encounter for Medicare annual wellness exam    Andrez Grime, LPN  04/27/8591

## 2017-09-29 NOTE — Patient Instructions (Addendum)
Mr. Bradley Ramirez , Thank you for taking time to come for your Medicare Wellness Visit. I appreciate your ongoing commitment to your health goals. Please review the following plan we discussed and let me know if I can assist you in the future.   Screening recommendations/referrals: Colonoscopy: up to date Recommended yearly ophthalmology/optometry visit for glaucoma screening and checkup Recommended yearly dental visit for hygiene and checkup  Vaccinations: Influenza vaccine: up to date Pneumococcal vaccine: up to date Tdap vaccine: declined due to insurance  Shingles vaccine: Check with your pharmacy about receiving the Shingrix vaccine    Advanced directives:Advance directive discussed with you today. I have provided a copy for you to complete at home and have notarized. Once this is complete please bring a copy in to our office so we can scan it into your chart.  Conditions/risks identified: You declined healthcare goal  Next appointment: today @ 2:40 pm with Dr. Carlota Raspberry, next Medicare Wellness Visit is 09/30/2018 @ 2:20 pm.  Preventive Care 65 Years and Older, Male Preventive care refers to lifestyle choices and visits with your health care provider that can promote health and wellness. What does preventive care include?  A yearly physical exam. This is also called an annual well check.  Dental exams once or twice a year.  Routine eye exams. Ask your health care provider how often you should have your eyes checked.  Personal lifestyle choices, including:  Daily care of your teeth and gums.  Regular physical activity.  Eating a healthy diet.  Avoiding tobacco and drug use.  Limiting alcohol use.  Practicing safe sex.  Taking low doses of aspirin every day.  Taking vitamin and mineral supplements as recommended by your health care provider. What happens during an annual well check? The services and screenings done by your health care provider during your annual well check  will depend on your age, overall health, lifestyle risk factors, and family history of disease. Counseling  Your health care provider may ask you questions about your:  Alcohol use.  Tobacco use.  Drug use.  Emotional well-being.  Home and relationship well-being.  Sexual activity.  Eating habits.  History of falls.  Memory and ability to understand (cognition).  Work and work Statistician. Screening  You may have the following tests or measurements:  Height, weight, and BMI.  Blood pressure.  Lipid and cholesterol levels. These may be checked every 5 years, or more frequently if you are over 77 years old.  Skin check.  Lung cancer screening. You may have this screening every year starting at age 66 if you have a 30-pack-year history of smoking and currently smoke or have quit within the past 15 years.  Fecal occult blood test (FOBT) of the stool. You may have this test every year starting at age 71.  Flexible sigmoidoscopy or colonoscopy. You may have a sigmoidoscopy every 5 years or a colonoscopy every 10 years starting at age 82.  Prostate cancer screening. Recommendations will vary depending on your family history and other risks.  Hepatitis C blood test.  Hepatitis B blood test.  Sexually transmitted disease (STD) testing.  Diabetes screening. This is done by checking your blood sugar (glucose) after you have not eaten for a while (fasting). You may have this done every 1-3 years.  Abdominal aortic aneurysm (AAA) screening. You may need this if you are a current or former smoker.  Osteoporosis. You may be screened starting at age 30 if you are at high risk. Talk with  your health care provider about your test results, treatment options, and if necessary, the need for more tests. Vaccines  Your health care provider may recommend certain vaccines, such as:  Influenza vaccine. This is recommended every year.  Tetanus, diphtheria, and acellular pertussis  (Tdap, Td) vaccine. You may need a Td booster every 10 years.  Zoster vaccine. You may need this after age 52.  Pneumococcal 13-valent conjugate (PCV13) vaccine. One dose is recommended after age 72.  Pneumococcal polysaccharide (PPSV23) vaccine. One dose is recommended after age 30. Talk to your health care provider about which screenings and vaccines you need and how often you need them. This information is not intended to replace advice given to you by your health care provider. Make sure you discuss any questions you have with your health care provider. Document Released: 10/06/2015 Document Revised: 05/29/2016 Document Reviewed: 07/11/2015 Elsevier Interactive Patient Education  2017 [place] Prevention in the Home Falls can cause injuries. They can happen to people of all ages. There are many things you can do to make your home safe and to help prevent falls. What can I do on the outside of my home?  Regularly fix the edges of walkways and driveways and fix any cracks.  Remove anything that might make you trip as you walk through a door, such as a raised step or threshold.  Trim any bushes or trees on the path to your home.  Use bright outdoor lighting.  Clear any walking paths of anything that might make someone trip, such as rocks or tools.  Regularly check to see if handrails are loose or broken. Make sure that both sides of any steps have handrails.  Any raised decks and porches should have guardrails on the edges.  Have any leaves, snow, or ice cleared regularly.  Use sand or salt on walking paths during winter.  Clean up any spills in your garage right away. This includes oil or grease spills. What can I do in the bathroom?  Use night lights.  Install grab bars by the toilet and in the tub and shower. Do not use towel bars as grab bars.  Use non-skid mats or decals in the tub or shower.  If you need to sit down in the shower, use a plastic, non-slip  stool.  Keep the floor dry. Clean up any water that spills on the floor as soon as it happens.  Remove soap buildup in the tub or shower regularly.  Attach bath mats securely with double-sided non-slip rug tape.  Do not have throw rugs and other things on the floor that can make you trip. What can I do in the bedroom?  Use night lights.  Make sure that you have a light by your bed that is easy to reach.  Do not use any sheets or blankets that are too big for your bed. They should not hang down onto the floor.  Have a firm chair that has side arms. You can use this for support while you get dressed.  Do not have throw rugs and other things on the floor that can make you trip. What can I do in the kitchen?  Clean up any spills right away.  Avoid walking on wet floors.  Keep items that you use a lot in easy-to-reach places.  If you need to reach something above you, use a strong step stool that has a grab bar.  Keep electrical cords out of the way.  Do not  use floor polish or wax that makes floors slippery. If you must use wax, use non-skid floor wax.  Do not have throw rugs and other things on the floor that can make you trip. What can I do with my stairs?  Do not leave any items on the stairs.  Make sure that there are handrails on both sides of the stairs and use them. Fix handrails that are broken or loose. Make sure that handrails are as long as the stairways.  Check any carpeting to make sure that it is firmly attached to the stairs. Fix any carpet that is loose or worn.  Avoid having throw rugs at the top or bottom of the stairs. If you do have throw rugs, attach them to the floor with carpet tape.  Make sure that you have a light switch at the top of the stairs and the bottom of the stairs. If you do not have them, ask someone to add them for you. What else can I do to help prevent falls?  Wear shoes that:  Do not have high heels.  Have rubber bottoms.  Are  comfortable and fit you well.  Are closed at the toe. Do not wear sandals.  If you use a stepladder:  Make sure that it is fully opened. Do not climb a closed stepladder.  Make sure that both sides of the stepladder are locked into place.  Ask someone to hold it for you, if possible.  Clearly mark and make sure that you can see:  Any grab bars or handrails.  First and last steps.  Where the edge of each step is.  Use tools that help you move around (mobility aids) if they are needed. These include:  Canes.  Walkers.  Scooters.  Crutches.  Turn on the lights when you go into a dark area. Replace any light bulbs as soon as they burn out.  Set up your furniture so you have a clear path. Avoid moving your furniture around.  If any of your floors are uneven, fix them.  If there are any pets around you, be aware of where they are.  Review your medicines with your doctor. Some medicines can make you feel dizzy. This can increase your chance of falling. Ask your doctor what other things that you can do to help prevent falls. This information is not intended to replace advice given to you by your health care provider. Make sure you discuss any questions you have with your health care provider. Document Released: 07/06/2009 Document Revised: 02/15/2016 Document Reviewed: 10/14/2014 Elsevier Interactive Patient Education  2017 [employer].

## 2017-09-29 NOTE — Progress Notes (Signed)
Subjective:  By signing my name below, I, Bradley Ramirez, attest that this documentation has been prepared under the direction and in the presence of Bradley Ray, MD. Electronically Signed: Moises Ramirez, [place]. 09/29/2017 , 3:28 PM .  Patient was seen in Room 11 .   Patient ID: Bradley Ramirez, male    DOB: Aug 08, 1941, 77 y.o.   MRN: [id number] Chief Complaint  Patient presents with  . Medication Refill    patient needs refills of medications   HPI Bradley Ramirez is a 77 y.o. male  He was seen earlier today by Andrez Grime, LPN for annual wellness visit. He has a history of HTN, cardiomyopathy and allergic rhinitis. Video sign language interpreter today, Bradley Ramirez.   HTN He takes Cardizem 180mg  QD. He states he's still taking it, without any complications or any side effects. He stays active and exercising, as well as getting outside yard work including mowing the lawn.   Cardiomyopathy He is followed by cardiologist, Dr. Percival Spanish. Last note by cardiologist on 08/23/16, plan for follow up in 1 year. He has an appointment on Jan 17th. Echo in Sept 2015, EF 50-55%.   GERD He has a history of GERD without current medications. He denied any recurrence of symptoms at last physical.   He states his reflux is mostly gone, only has rare occurrence. He avoids spicy foods.   Asthma His asthma was mild and intermittent by history, with some cough noted last visit. Component of COPD versus asthma by xray. He was restarted on Qvar, but switched to [employer] for insurance coverage. He also takes OTC Claritin as needed for allergies.   He uses his Flovent inhaler every morning and night. He mentions Flovent 61mcg instead of 80. He denies any side effects or soreness in his mouth. He does note having rare small pieces of discolored green mucus; believes upper airway, occurs for about 3 days and then it'll resolve on its own.   Patient Active Problem List   Diagnosis Date Noted  .  Deaf 10/28/2013  . CARDIOMYOPATHY 04/20/2009  . HYPERTENSION, UNSPECIFIED 04/19/2009  . HIATAL HERNIA, HX OF 04/19/2009   Past Medical History:  Diagnosis Date  . Anxiety   . Chest pain    per pt/ was resolved/ was [place] per pt.  . Deafness    Pt does sign language!  Marland Kitchen Dysphagia, unspecified(787.20)   . Esophageal spasm   . Esophageal stricture   . GERD (gastroesophageal reflux disease)    no meds  . Hiatal hernia   . Hypertension   . Murmur, diastolic 0272   U/S was ok per pt.  . S/P colonoscopy    Past Surgical History:  Procedure Laterality Date  . COLONOSCOPY    . TONSILLECTOMY     as a child  . UPPER GASTROINTESTINAL ENDOSCOPY     No Known Allergies Prior to Admission medications   Medication Sig Start Date End Date Taking? Authorizing Provider  aspirin 81 MG tablet Take 81 mg by mouth daily.      [provider]  diltiazem (CARDIZEM CD) 180 MG 24 hr capsule TAKE 1 CAPSULE (180 MG TOTAL) BY MOUTH DAILY. 09/26/16   Wendie Agreste, MD  fluticasone (FLOVENT HFA) 44 MCG/ACT inhaler Inhale 2 puffs into the lungs 2 (two) times daily. 10/02/16   Wendie Agreste, MD   Social History   Socioeconomic History  . Marital status: Married    Spouse name: Not on file  . Number of children:  4  . Years of education: Not on file  . Highest education level: Some college, no degree  Social Needs  . Financial resource strain: Not hard at all  . Food insecurity - worry: Never true  . Food insecurity - inability: Never true  . Transportation needs - medical: No  . Transportation needs - non-medical: No  Occupational History  . Occupation: retired    Fish farm manager: [employer]  Tobacco Use  . Smoking status: Former Smoker    Types: Cigarettes    Last attempt to quit: 1987    Years since quitting: 32.0  . Smokeless tobacco: Never Used  Substance and Sexual Activity  . Alcohol use: No    Comment: quit 1987  . Drug use: No  . Sexual activity: Not on file  Other Topics Concern   . Not on file  Social History Narrative   Married   Works as custodian   Education:  [place]: Negative for fatigue and unexpected weight change.  Eyes: Negative for visual disturbance.  Respiratory: Negative for cough, chest tightness and shortness of breath.   Cardiovascular: Negative for chest pain, palpitations and leg swelling.  Gastrointestinal: Negative for abdominal pain and Ramirez in stool.  Neurological: Negative for dizziness, light-headedness and headaches.       Objective:   Physical Exam  Constitutional: He is oriented to person, place, and time. He appears well-developed and well-nourished.  HENT:  Head: Normocephalic and atraumatic.  Eyes: EOM are normal. Pupils are equal, round, and reactive to light.  Neck: No JVD present. Carotid bruit is not present.  Cardiovascular: Normal rate, regular rhythm and normal heart sounds.  No murmur heard. Pulmonary/Chest: Effort normal and breath sounds normal. No respiratory distress. He has no wheezes. He has no rales.  Musculoskeletal: He exhibits no edema.  Neurological: He is alert and oriented to person, place, and time.  Skin: Skin is warm and dry.  Psychiatric: He has a normal mood and affect.  Vitals reviewed.   Vitals:   09/29/17 1500  BP: 130/80  Pulse: 90  Resp: 16  SpO2: 100%  Weight: 166 lb 4 oz (75.4 kg)  Height: 6\' 1"  (1.854 m)      Assessment & Plan:  Bradley Ramirez is a 77 y.o. male Essential hypertension - Plan: diltiazem (CARDIZEM CD) 180 MG 24 hr capsule  - Stable, tolerating Cardizem at current dose. Refilled. Labs pending from office visit earlier in day.   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 Continue Flovent 44 MCG 2 puffs twice a day. RTC precautions if persistent or worsening cough, or worsening productive cough.  History of  gastroesophageal reflux (GERD)  -Overall asymptomatic. Occasional cough may be related to reflux versus asthma. Trigger avoidance discussed, RTC precautions.   Sign language video interpreter used with understanding expressed, all questions answered  Meds ordered this encounter  Medications  . diltiazem (CARDIZEM CD) 180 MG 24 hr capsule    Sig: TAKE 1 CAPSULE (180 MG TOTAL) BY MOUTH DAILY.    Dispense:  90 capsule    Refill:  3  . fluticasone (FLOVENT HFA) 44 MCG/ACT inhaler    Sig: Inhale 2 puffs into the lungs 2 (two) times daily.    Dispense:  1 Inhaler    Refill:  12   Patient Instructions   No change in Ramirez pressure medicine for now. Keep follow  up with cardiology as planned.   Continue flovent same dose for asthma.   If any increase in cough or phlegm, please follow up to discuss those symptoms further.   Thanks you for coming in today.    Food Choices for Gastroesophageal Reflux Disease, Adult When you have gastroesophageal reflux disease (GERD), the foods you eat and your eating habits are very important. Choosing the right foods can help ease your discomfort. What guidelines do I need to follow?  Choose fruits, vegetables, whole grains, and low-fat dairy products.  Choose low-fat meat, fish, and poultry.  Limit fats such as oils, salad dressings, butter, nuts, and avocado.  Keep a food diary. This helps you identify foods that cause symptoms.  Avoid foods that cause symptoms. These may be different for everyone.  Eat small meals often instead of 3 large meals a day.  Eat your meals slowly, in a place where you are relaxed.  Limit fried foods.  Cook foods using methods other than frying.  Avoid drinking alcohol.  Avoid drinking large amounts of liquids with your meals.  Avoid bending over or lying down until 2-3 hours after eating. What foods are not recommended? These are some foods and drinks that may make your symptoms worse: Vegetables Tomatoes.  Tomato juice. Tomato and spaghetti sauce. Chili peppers. Onion and garlic. Horseradish. Fruits Oranges, grapefruit, and lemon (fruit and juice). Meats High-fat meats, fish, and poultry. This includes hot dogs, ribs, ham, sausage, salami, and bacon. Dairy Whole milk and chocolate milk. Sour cream. Cream. Butter. Ice cream. Cream cheese. Drinks Coffee and tea. Bubbly (carbonated) drinks or energy drinks. Condiments Hot sauce. Barbecue sauce. Sweets/Desserts Chocolate and cocoa. Donuts. Peppermint and spearmint. Fats and Oils High-fat foods. This includes [place] fries and potato chips. Other Vinegar. Strong spices. This includes black pepper, white pepper, red pepper, cayenne, curry powder, cloves, ginger, and chili powder. The items listed above may not be a complete list of foods and drinks to avoid. Contact your dietitian for more information. This information is not intended to replace advice given to you by your health care provider. Make sure you discuss any questions you have with your health care provider. Document Released: 03/10/2012 Document Revised: 02/15/2016 Document Reviewed: 07/14/2013 Elsevier Interactive Patient Education  2017 [employer].     IF you received an x-Ramirez today, you will receive an invoice from [facility]. Please contact [facility] at [phone] with questions or concerns regarding your invoice.   IF you received labwork today, you will receive an invoice from [place]. Please contact [facility] at [phone] with questions or concerns regarding your invoice.   Our billing staff will not be able to assist you with questions regarding bills from these companies.  You will be contacted with the lab results as soon as they are available. The fastest way to get your results is to activate your My Chart account. Instructions are located on the last page of this paperwork. If you have not heard from [place] regarding the results in 2 weeks,  please contact this office.       I personally performed the services described in this documentation, which was scribed in my presence. The recorded information has been reviewed and considered for accuracy and completeness, addended by me as needed, and agree with information above.  Signed,   Bradley Ray, MD [facility].  10/01/17 12:29 PM

## 2017-09-29 NOTE — Patient Instructions (Addendum)
No change in blood pressure medicine for now. Keep follow up with cardiology as planned.   Continue flovent same dose for asthma.   If any increase in cough or phlegm, please follow up to discuss those symptoms further.   Thanks you for coming in today.    Food Choices for Gastroesophageal Reflux Disease, Adult When you have gastroesophageal reflux disease (GERD), the foods you eat and your eating habits are very important. Choosing the right foods can help ease your discomfort. What guidelines do I need to follow?  Choose fruits, vegetables, whole grains, and low-fat dairy products.  Choose low-fat meat, fish, and poultry.  Limit fats such as oils, salad dressings, butter, nuts, and avocado.  Keep a food diary. This helps you identify foods that cause symptoms.  Avoid foods that cause symptoms. These may be different for everyone.  Eat small meals often instead of 3 large meals a day.  Eat your meals slowly, in a place where you are relaxed.  Limit fried foods.  Cook foods using methods other than frying.  Avoid drinking alcohol.  Avoid drinking large amounts of liquids with your meals.  Avoid bending over or lying down until 2-3 hours after eating. What foods are not recommended? These are some foods and drinks that may make your symptoms worse: Vegetables Tomatoes. Tomato juice. Tomato and spaghetti sauce. Chili peppers. Onion and garlic. Horseradish. Fruits Oranges, grapefruit, and lemon (fruit and juice). Meats High-fat meats, fish, and poultry. This includes hot dogs, ribs, ham, sausage, salami, and bacon. Dairy Whole milk and chocolate milk. Sour cream. Cream. Butter. Ice cream. Cream cheese. Drinks Coffee and tea. Bubbly (carbonated) drinks or energy drinks. Condiments Hot sauce. Barbecue sauce. Sweets/Desserts Chocolate and cocoa. Donuts. Peppermint and spearmint. Fats and Oils High-fat foods. This includes [place] fries and potato chips. Other Vinegar.  Strong spices. This includes black pepper, white pepper, red pepper, cayenne, curry powder, cloves, ginger, and chili powder. The items listed above may not be a complete list of foods and drinks to avoid. Contact your dietitian for more information. This information is not intended to replace advice given to you by your health care provider. Make sure you discuss any questions you have with your health care provider. Document Released: 03/10/2012 Document Revised: 02/15/2016 Document Reviewed: 07/14/2013 Elsevier Interactive Patient Education  2017 [employer].     IF you received an x-ray today, you will receive an invoice from [facility]. Please contact [facility] at [phone] with questions or concerns regarding your invoice.   IF you received labwork today, you will receive an invoice from [place]. Please contact [facility] at [phone] with questions or concerns regarding your invoice.   Our billing staff will not be able to assist you with questions regarding bills from these companies.  You will be contacted with the lab results as soon as they are available. The fastest way to get your results is to activate your My Chart account. Instructions are located on the last page of this paperwork. If you have not heard from [place] regarding the results in 2 weeks, please contact this office.

## 2017-09-30 LAB — CBC WITH DIFFERENTIAL/PLATELET
Basophils Absolute: 0 10*3/uL (ref 0.0–0.2)
Basos: 0 %
EOS (ABSOLUTE): 0.1 10*3/uL (ref 0.0–0.4)
Eos: 1 %
Hematocrit: 34.1 % — ABNORMAL LOW (ref 37.5–51.0)
Hemoglobin: 11.3 g/dL — ABNORMAL LOW (ref 13.0–17.7)
Immature Grans (Abs): 0 10*3/uL (ref 0.0–0.1)
Immature Granulocytes: 0 %
Lymphocytes Absolute: 1.8 10*3/uL (ref 0.7–3.1)
Lymphs: 28 %
MCH: 30.5 pg (ref 26.6–33.0)
MCHC: 33.1 g/dL (ref 31.5–35.7)
MCV: 92 fL (ref 79–97)
Monocytes Absolute: 0.6 10*3/uL (ref 0.1–0.9)
Monocytes: 9 %
Neutrophils Absolute: 4.1 10*3/uL (ref 1.4–7.0)
Neutrophils: 62 %
Platelets: 246 10*3/uL (ref 150–379)
RBC: 3.7 x10E6/uL — ABNORMAL LOW (ref 4.14–5.80)
RDW: 14.8 % (ref 12.3–15.4)
WBC: 6.6 10*3/uL (ref 3.4–10.8)

## 2017-09-30 LAB — COMPREHENSIVE METABOLIC PANEL
ALT: 9 IU/L (ref 0–44)
AST: 13 IU/L (ref 0–40)
Albumin/Globulin Ratio: 1.4 (ref 1.2–2.2)
Albumin: 4.2 g/dL (ref 3.5–4.8)
Alkaline Phosphatase: 83 IU/L (ref 39–117)
BUN/Creatinine Ratio: 12 (ref 10–24)
BUN: 18 mg/dL (ref 8–27)
Bilirubin Total: 0.2 mg/dL (ref 0.0–1.2)
CO2: 23 mmol/L (ref 20–29)
Calcium: 9.1 mg/dL (ref 8.6–10.2)
Chloride: 102 mmol/L (ref 96–106)
Creatinine, Ser: 1.5 mg/dL — ABNORMAL HIGH (ref 0.76–1.27)
GFR calc Af Amer: 51 mL/min/{1.73_m2} — ABNORMAL LOW (ref >59)
GFR calc non Af Amer: 44 mL/min/{1.73_m2} — ABNORMAL LOW (ref >59)
Globulin, Total: 3.1 g/dL (ref 1.5–4.5)
Glucose: 93 mg/dL (ref 65–99)
Potassium: 4.4 mmol/L (ref 3.5–5.2)
Sodium: 141 mmol/L (ref 134–144)
Total Protein: 7.3 g/dL (ref 6.0–8.5)

## 2017-09-30 LAB — LIPID PANEL
CHOL/HDL RATIO: 3.3 ratio (ref 0.0–5.0)
Cholesterol, Total: 186 mg/dL (ref 100–199)
HDL: 57 mg/dL (ref >39)
LDL CALC: 114 mg/dL — AB (ref 0–99)
Triglycerides: 75 mg/dL (ref 0–149)
VLDL CHOLESTEROL CAL: 15 mg/dL (ref 5–40)

## 2017-10-01 ENCOUNTER — Encounter: Payer: Self-pay | Admitting: Family Medicine

## 2017-10-08 NOTE — Progress Notes (Signed)
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  His ejection fraction was 35% at one time.  Most recently it was 50% with mild AI.  He returns for one year follow up.  He is been doing well.  He still works every week.  The patient denies any new symptoms such as chest discomfort, neck or arm discomfort. There has been no new shortness of breath, PND or orthopnea. There have been no reported palpitations, presyncope or syncope.  His wife says that he naps during the day.  However, he feels rested after a nap and says that this is because he works in the morning.    No Known Allergies  Current Outpatient Medications  Medication Sig Dispense Refill  . aspirin 81 MG tablet Take 81 mg by mouth daily.      Marland Kitchen diltiazem (CARDIZEM CD) 180 MG 24 hr capsule TAKE 1 CAPSULE (180 MG TOTAL) BY MOUTH DAILY. 90 capsule 3  . fluticasone (FLOVENT HFA) 44 MCG/ACT inhaler Inhale 2 puffs into the lungs 2 (two) times daily. 1 Inhaler 12   Current Facility-Administered Medications  Medication Dose Route Frequency Provider Last Rate Last Dose  . 0.9 %  sodium chloride infusion  500 mL Intravenous Continuous Armbruster, Carlota Raspberry, MD        Past Medical History:  Diagnosis Date  . Anxiety   . Chest pain    per pt/ was resolved/ was [place] per pt.  . Deafness    Pt does sign language!  Marland Kitchen Dysphagia, unspecified(787.20)   . Esophageal spasm   . Esophageal stricture   . GERD (gastroesophageal reflux disease)    no meds  . Hiatal hernia   . Hypertension   . Murmur, diastolic 1749   U/S was ok per pt.  . S/P colonoscopy     Past Surgical History:  Procedure Laterality Date  . COLONOSCOPY    . TONSILLECTOMY     as a child  . UPPER GASTROINTESTINAL ENDOSCOPY      ROS:     As stated in the HPI and negative for all other systems.  PHYSICAL EXAM BP (!) 149/80   Pulse 64   Ht 6\' 2"  (1.88 m)   Wt 166 lb 3.2 oz (75.4 kg)   BMI 21.34 kg/m   GENERAL:  Well  appearing NECK:  No jugular venous distention, waveform within normal limits, carotid upstroke brisk and symmetric, no bruits, no thyromegaly LUNGS:  Clear to auscultation bilaterally CHEST:  Unremarkable HEART:  PMI not displaced or sustained,S1 and S2 within normal limits, no S3, no S4, no clicks, no rubs, diastolic murmur ending in mid diastole and heard best at the left midsternal border, no systolic murmurs ABD:  Flat, positive bowel sounds normal in frequency in pitch, no bruits, no rebound, no guarding, no midline pulsatile mass, no hepatomegaly, no splenomegaly EXT:  2 plus pulses throughout, no edema, no cyanosis no clubbing   EKG:  Sinus rhythm with sinus arrhythmia, rate 64, axis within normal limits, intervals within normal limits,no acute ST-T wave changes.  PACs  10/09/2017  ASSESSMENT AND PLAN  CARDIOMYOPATHY -  He had a mildly reduced ejection fraction on echo.  I will follow up as below.   HYPERTENSION, UNSPECIFIED -  The blood pressure is elevated.  He is going to get a BP cuff and I will review these readings as he might need to start a medication.    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  I will follow up with an echocardiogram.

## 2017-10-09 ENCOUNTER — Encounter: Payer: Self-pay | Admitting: Cardiology

## 2017-10-09 ENCOUNTER — Encounter: Payer: Self-pay | Admitting: Radiology

## 2017-10-09 ENCOUNTER — Ambulatory Visit: Payer: Medicare Other | Admitting: Cardiology

## 2017-10-09 VITALS — BP 149/80 | HR 64 | Ht 74.0 in | Wt 166.2 lb

## 2017-10-09 DIAGNOSIS — I351 Nonrheumatic aortic (valve) insufficiency: Secondary | ICD-10-CM | POA: Diagnosis not present

## 2017-10-09 DIAGNOSIS — I420 Dilated cardiomyopathy: Secondary | ICD-10-CM

## 2017-10-09 DIAGNOSIS — I10 Essential (primary) hypertension: Secondary | ICD-10-CM

## 2017-10-09 NOTE — Patient Instructions (Signed)

## 2017-10-21 ENCOUNTER — Ambulatory Visit (HOSPITAL_COMMUNITY): Payer: Medicare Other | Attending: Cardiovascular Disease

## 2017-10-21 ENCOUNTER — Other Ambulatory Visit: Payer: Self-pay

## 2017-10-21 DIAGNOSIS — I351 Nonrheumatic aortic (valve) insufficiency: Secondary | ICD-10-CM | POA: Diagnosis not present

## 2017-10-21 DIAGNOSIS — I080 Rheumatic disorders of both mitral and aortic valves: Secondary | ICD-10-CM | POA: Diagnosis not present

## 2017-10-31 ENCOUNTER — Ambulatory Visit (INDEPENDENT_AMBULATORY_CARE_PROVIDER_SITE_OTHER): Payer: Medicare Other | Admitting: Physician Assistant

## 2017-10-31 ENCOUNTER — Encounter: Payer: Self-pay | Admitting: Physician Assistant

## 2017-10-31 ENCOUNTER — Ambulatory Visit (INDEPENDENT_AMBULATORY_CARE_PROVIDER_SITE_OTHER): Payer: Medicare Other

## 2017-10-31 VITALS — BP 122/72 | HR 69 | Temp 97.3°F | Resp 17 | Ht 74.0 in | Wt 167.0 lb

## 2017-10-31 DIAGNOSIS — K921 Melena: Secondary | ICD-10-CM | POA: Diagnosis not present

## 2017-10-31 DIAGNOSIS — K5900 Constipation, unspecified: Secondary | ICD-10-CM

## 2017-10-31 LAB — POCT CBC
Granulocyte percent: 70.8 % (ref 37–80)
HCT, POC: 34.1 % — AB (ref 43.5–53.7)
Hemoglobin: 11.4 g/dL — AB (ref 14.1–18.1)
Lymph, poc: 1.4 (ref 0.6–3.4)
MCH, POC: 30.6 pg (ref 27–31.2)
MCHC: 33.3 g/dL (ref 31.8–35.4)
MCV: 91.7 fL (ref 80–97)
MID (cbc): 0.3 (ref 0–0.9)
MPV: 6.1 fL (ref 0–99.8)
POC Granulocyte: 4.2 (ref 2–6.9)
POC LYMPH PERCENT: 23.9 %L (ref 10–50)
POC MID %: 5.3 %M (ref 0–12)
Platelet Count, POC: 271 10*3/uL (ref 142–424)
RBC: 3.71 M/uL — AB (ref 4.69–6.13)
RDW, POC: 14.4 %
WBC: 5.9 10*3/uL (ref 4.6–10.2)

## 2017-10-31 LAB — CMP14+EGFR
ALT: 9 IU/L (ref 0–44)
AST: 17 IU/L (ref 0–40)
Albumin/Globulin Ratio: 1.4 (ref 1.2–2.2)
Albumin: 4.2 g/dL (ref 3.5–4.8)
Alkaline Phosphatase: 85 IU/L (ref 39–117)
BUN/Creatinine Ratio: 12 (ref 10–24)
BUN: 17 mg/dL (ref 8–27)
Bilirubin Total: 0.4 mg/dL (ref 0.0–1.2)
CO2: 21 mmol/L (ref 20–29)
Calcium: 9.2 mg/dL (ref 8.6–10.2)
Chloride: 101 mmol/L (ref 96–106)
Creatinine, Ser: 1.39 mg/dL — ABNORMAL HIGH (ref 0.76–1.27)
GFR calc Af Amer: 56 mL/min/{1.73_m2} — ABNORMAL LOW (ref >59)
GFR calc non Af Amer: 49 mL/min/{1.73_m2} — ABNORMAL LOW (ref >59)
Globulin, Total: 3 g/dL (ref 1.5–4.5)
Glucose: 86 mg/dL (ref 65–99)
Potassium: 4.9 mmol/L (ref 3.5–5.2)
Sodium: 139 mmol/L (ref 134–144)
Total Protein: 7.2 g/dL (ref 6.0–8.5)

## 2017-10-31 LAB — POC HEMOCCULT BLD/STL (OFFICE/1-CARD/DIAGNOSTIC): Fecal Occult Blood, POC: NEGATIVE

## 2017-10-31 MED ORDER — POLYETHYLENE GLYCOL 3350 17 GM/SCOOP PO POWD: 17.0000 g | Freq: Two times a day (BID) | ORAL | 1 refills | Status: DC | PRN

## 2017-10-31 NOTE — Progress Notes (Signed)
Bradley Ramirez  MRN: [id number] DOB: 1941-08-02  PCP: Wendie Agreste, MD  Subjective:  Pt is a pleasant 77 year old male PMH HTN, cardiomyopathy who presents to clinic for black stools x 3 days. He is deaf, ASL interpreter used today - #100007. He is here today with his wife.   Black stool 2 days ago "black and hard". Strain to have BM. + "farting"  Yesterday small volume of stool. No red blood in the tissue. Did not see red in the toilet bowl. "very black/brown on the tissue".  No bowel movement today.  Last time ha passed gas was early this morning.   usually one BM daily.  "I eat and feel full, like the food is no going down"  Is bothersome.  He is eating normal amount. Some decrease in appetite.  Denies fever, chills, abdominal pain.   Last colonoscopy 12/09/2016: polyps without malignancy. Unknown when next colonoscopy is scheduled.   He is eating danish, coffee, chicken, pork and beans, peanut butter. He drinks 40 oz water/daily. He does not exercise.  Never been treated for constipation in the past.    Review of Systems  Constitutional: Negative for activity change, appetite change, chills, diaphoresis, fatigue, fever and unexpected weight change.  Gastrointestinal: Positive for blood in stool and constipation. Negative for abdominal distention, abdominal pain, anal bleeding, diarrhea, nausea, rectal pain and vomiting.    Patient Active Problem List   Diagnosis Date Noted  . Deaf 10/28/2013  . CARDIOMYOPATHY 04/20/2009  . HYPERTENSION, UNSPECIFIED 04/19/2009  . HIATAL HERNIA, HX OF 04/19/2009    Current Outpatient Medications on File Prior to Visit  Medication Sig Dispense Refill  . aspirin 81 MG tablet Take 81 mg by mouth daily.      Marland Kitchen diltiazem (CARDIZEM CD) 180 MG 24 hr capsule TAKE 1 CAPSULE (180 MG TOTAL) BY MOUTH DAILY. 90 capsule 3  . fluticasone (FLOVENT HFA) 44 MCG/ACT inhaler Inhale 2 puffs into the lungs 2 (two) times daily. 1 Inhaler 12   Current  Facility-Administered Medications on File Prior to Visit  Medication Dose Route Frequency Provider Last Rate Last Dose  . 0.9 %  sodium chloride infusion  500 mL Intravenous Continuous Armbruster, Carlota Raspberry, MD        No Known Allergies   Objective:  BP 122/72   Pulse 69   Temp (!) 97.3 F (36.3 C) (Oral)   Resp 17   Ht _0  (1.88 m)   Wt 167 lb (75.8 kg)   SpO2 98%   BMI 21.44 kg/m   Physical Exam  Constitutional: He is oriented to person, place, and time and well-developed, well-nourished, and in no distress. No distress.  Cardiovascular: Normal rate, regular rhythm and normal heart sounds.  Genitourinary: Rectal exam shows external hemorrhoid (not thrombosed). Rectal exam shows guaiac negative stool.  Neurological: He is alert and oriented to person, place, and time. GCS score is 15.  Skin: Skin is warm and dry.  Psychiatric: Mood, memory, affect and judgment normal.  Vitals reviewed.   Results for orders placed or performed in visit on 10/31/17  POCT CBC  Result Value Ref Range   WBC 5.9 4.6 - 10.2 K/uL   Lymph, poc 1.4 0.6 - 3.4   POC LYMPH PERCENT 23.9 10 - 50 %L   MID (cbc) 0.3 0 - 0.9   POC MID % 5.3 0 - 12 %M   POC Granulocyte 4.2 2 - 6.9   Granulocyte percent 70.8 37 -  80 %G   RBC 3.71 (A) 4.69 - 6.13 M/uL   Hemoglobin 11.4 (A) 14.1 - 18.1 g/dL   HCT, POC 34.1 (A) 43.5 - 53.7 %   MCV 91.7 80 - 97 fL   MCH, POC 30.6 27 - 31.2 pg   MCHC 33.3 31.8 - 35.4 g/dL   RDW, POC 14.4 %   Platelet Count, POC 271 142 - 424 K/uL   MPV 6.1 0 - 99.8 fL  POC Hemoccult Bld/Stl (1-Cd Office Dx)  Result Value Ref Range   Card #1 Date 10-31-17    Fecal Occult Blood, POC Negative Negative   Dg Abd 2 Views  Result Date: 10/31/2017 CLINICAL DATA:  Gradual decrease in bowel movements. Blood in stool. EXAM: ABDOMEN - 2 VIEW COMPARISON:  None. FINDINGS: There is no bowel dilation to suggest obstruction or generalized adynamic ileus. Increase in small bowel gas noted centrally. Mild  generalized increased stool burden throughout the colon. No free air. No evidence of renal or ureteral stones. Soft tissues are unremarkable. There are degenerative changes of the lumbar spine. Arthropathic/degenerative changes are noted of the right hip. IMPRESSION: 1. No acute findings. No evidence of bowel obstruction. No free air. 2. Mild increase in stool throughout the colon. Electronically Signed   By: Lajean Manes M.D.   On: 10/31/2017 11:54    Assessment and Plan :  1. Constipation, unspecified constipation type - polyethylene glycol powder (GLYCOLAX/MIRALAX) powder; Take 17 g by mouth 2 (two) times daily as needed.  Dispense: 578 g; Refill: 1 - Pt presents c/o blood in stool. Negative FOB. CBC wnl. Abdominal x-ray shows increased stool burden. Discussed at length constipation prevention. Plan to treat with Miralax. RTC in 1-2 weeks if no improvement. Last colonoscopy 12/09/2016: polyps without malignancy. Unknown when next colonoscopy is scheduled. Advised pt to call and find out when next colonoscopy is recommended.   2. Blood in stool - POCT CBC - H. pylori breath test - Pathologist smear review - CMP14+EGFR - POC Hemoccult Bld/Stl (1-Cd Office Dx) - DG Abd 2 Views; Future   Mercer Pod, PA-C  [facility] 10/31/2017 11:01 AM

## 2017-10-31 NOTE — Patient Instructions (Addendum)
-No blood was seen today in your stool. I am treating you today for constipation (see option 1 or 2 below). I sent Miralax to your pharmacy - Your abdominal x-ray suggests constipation. See below for treatment instructions.  - Come back if your symptoms do not improve of if they worsen.  - The H. Pylori lab is still pending. If this is positive, we will contact you with the plan for treatment.  - Follow-up with Dr. Carlota Raspberry for routine appointments. - Call to find out when your next colonoscopy is due.  For constipation  Look up "6 Reasons to Care about [place]" at [place] (WindowBlog.ch)  1) Water: Make sure you are drinking enough water daily - about 1-3 liters. 2) Fiber: Make sure you are getting enough fiber in your diet - this will make you regular - you can eat high fiber foods or use metamucil as a supplement - it is really important to drink enough water when using fiber supplements. Foods that have a lot of fiber include vegetables, fruits, beans, nuts, oatmeal, and some breads and cereals. You can tell how much fiber is in a food by reading the nutrition label. Doctors recommend eating 25 to 36 grams of fiber each day. 3) Fitness: Increasing your physical activity will help increase the natural movement of your bowels. Try to get 20-30 minutes of exercise daily. This includes walking/running. 4) Use a 9" stool in front of your toilet to rest your feet on while you have a bowel movement. This will loosen your rectal muscles to help your stool come out easier and prevent straining.   If your stools are hard or are formed balls or you have to strain a stool softener will help - use colace (buy at pharmacy) 2-3 capsule a day  Option 1) For gentle treatment of constipation Use Miralax 1-2 capfuls a day until your stools are soft and regular and then decrease the usage - you can use this daily  Option 2) For more aggressive treatment of  constipation Use 4 capfuls of Colace (buy this over the counter at pharmacy) and 6 doses of Miralax and drink it in 2 hours - this should result in several watery stools - if it does not repeat the next day and then go to daily miralax for a week to make sure your bowels are clean and retrained to work properly   Thank you for coming in today. I hope you feel we met your needs.  Feel free to call PCP if you have any questions or further requests.  Please consider signing up for MyChart if you do not already have it, as this is a great way to communicate with me.  Best,  Whitney McVey, PA-C   Constipation, Adult Constipation is when a person has fewer bowel movements in a week than normal, has difficulty having a bowel movement, or has stools that are dry, hard, or larger than normal. Constipation may be caused by an underlying condition. It may become worse with age if a person takes certain medicines and does not take in enough fluids. Follow these instructions at home: Eating and drinking   Eat foods that have a lot of fiber, such as fresh fruits and vegetables, whole grains, and beans.  Limit foods that are high in fat, low in fiber, or overly processed, such as french fries, hamburgers, cookies, candies, and soda.  Drink enough fluid to keep your urine clear or pale yellow. General instructions  Exercise  regularly or as told by your health care provider.  Go to the restroom when you have the urge to go. Do not hold it in.  Take over-the-counter and prescription medicines only as told by your health care provider. These include any fiber supplements.  Practice pelvic floor retraining exercises, such as deep breathing while relaxing the lower abdomen and pelvic floor relaxation during bowel movements.  Watch your condition for any changes.  Keep all follow-up visits as told by your health care provider. This is important. Contact a health care provider if:  You have pain that  gets worse.  You have a fever.  You do not have a bowel movement after 4 days.  You vomit.  You are not hungry.  You lose weight.  You are bleeding from the anus.  You have thin, pencil-like stools. Get help right away if:  You have a fever and your symptoms suddenly get worse.  You leak stool or have blood in your stool.  Your abdomen is bloated.  You have severe pain in your abdomen.  You feel dizzy or you faint. This information is not intended to replace advice given to you by your health care provider. Make sure you discuss any questions you have with your health care provider. Document Released: 06/07/2004 Document Revised: 03/29/2016 Document Reviewed: 02/28/2016 Elsevier Interactive Patient Education  2018 [employer].  IF you received an x-ray today, you will receive an invoice from [facility]. Please contact [facility] at [phone] with questions or concerns regarding your invoice.   IF you received labwork today, you will receive an invoice from [place]. Please contact [facility] at [phone] with questions or concerns regarding your invoice.   Our billing staff will not be able to assist you with questions regarding bills from these companies.  You will be contacted with the lab results as soon as they are available. The fastest way to get your results is to activate your My Chart account. Instructions are located on the last page of this paperwork. If you have not heard from [place] regarding the results in 2 weeks, please contact this office.

## 2017-11-03 ENCOUNTER — Encounter: Payer: Self-pay | Admitting: Physician Assistant

## 2017-11-03 DIAGNOSIS — A048 Other specified bacterial intestinal infections: Secondary | ICD-10-CM

## 2017-11-03 LAB — H. PYLORI BREATH TEST: H pylori Breath Test: POSITIVE — AB

## 2017-11-03 MED ORDER — OMEPRAZOLE 20 MG PO CPDR: 20.0000 mg | DELAYED_RELEASE_CAPSULE | Freq: Two times a day (BID) | ORAL | 0 refills | Status: DC

## 2017-11-03 MED ORDER — CLARITHROMYCIN 500 MG PO TABS
500.0000 mg | ORAL_TABLET | Freq: Two times a day (BID) | ORAL | 0 refills | Status: AC
Start: 2017-11-03 — End: 2017-11-13

## 2017-11-03 MED ORDER — METRONIDAZOLE 500 MG PO TABS: 500.0000 mg | ORAL_TABLET | Freq: Two times a day (BID) | ORAL | 0 refills | Status: AC

## 2017-11-03 MED ORDER — AMOXICILLIN 500 MG PO CAPS: 1000.0000 mg | ORAL_CAPSULE | Freq: Two times a day (BID) | ORAL | 0 refills | Status: AC

## 2017-11-09 LAB — PATHOLOGIST SMEAR REVIEW
Basophils Absolute: 0 10*3/uL (ref 0.0–0.2)
Basos: 0 %
EOS (ABSOLUTE): 0 10*3/uL (ref 0.0–0.4)
Eos: 1 %
Hematocrit: 34.9 % — ABNORMAL LOW (ref 37.5–51.0)
Hemoglobin: 11.6 g/dL — ABNORMAL LOW (ref 13.0–17.7)
Immature Grans (Abs): 0 10*3/uL (ref 0.0–0.1)
Immature Granulocytes: 0 %
Lymphocytes Absolute: 1.4 10*3/uL (ref 0.7–3.1)
Lymphs: 23 %
MCH: 30.5 pg (ref 26.6–33.0)
MCHC: 33.2 g/dL (ref 31.5–35.7)
MCV: 92 fL (ref 79–97)
Monocytes Absolute: 0.5 10*3/uL (ref 0.1–0.9)
Monocytes: 8 %
Neutrophils Absolute: 4.1 10*3/uL (ref 1.4–7.0)
Neutrophils: 68 %
Path Rev PLTs: NORMAL
Path Rev WBC: NORMAL
Platelets: 259 10*3/uL (ref 150–379)
RBC: 3.8 x10E6/uL — ABNORMAL LOW (ref 4.14–5.80)
RDW: 14.6 % (ref 12.3–15.4)
WBC: 6.1 10*3/uL (ref 3.4–10.8)

## 2017-11-24 DIAGNOSIS — K5900 Constipation, unspecified: Secondary | ICD-10-CM | POA: Insufficient documentation

## 2018-01-02 ENCOUNTER — Telehealth: Payer: Self-pay | Admitting: Family Medicine

## 2018-01-02 NOTE — Telephone Encounter (Signed)
Pt requesting medication to help him relax during flying.   PCP: Dr. Carlota Raspberry  Pharmacy: [facility]

## 2018-01-02 NOTE — Telephone Encounter (Signed)
Copied from [place] [id number]. Topic: Quick Communication - Rx Refill/Question >> Jan 02, 2018  9:27 AM Arletha Grippe wrote: Medication: something to hel relax during flying  Has the patient contacted their pharmacy? No. New med  (Agent: If no, request that the patient contact the pharmacy for the refill.) Preferred Pharmacy (with phone number or street name): [email or web address]  Agent: Please be advised that RX refills may take up to 3 business days. We ask that you follow-up with your pharmacy.

## 2018-01-02 NOTE — Telephone Encounter (Signed)
Patient needs office visit for new prescriptions. Please set up appointment.

## 2018-01-05 NOTE — Telephone Encounter (Signed)
Spoke with pt today- made an appt with Dr. Carlota Raspberry for Friday at 4:00 pm  Advised pt of time, building number and late policy.

## 2018-01-09 ENCOUNTER — Encounter: Payer: Self-pay | Admitting: Family Medicine

## 2018-01-09 ENCOUNTER — Other Ambulatory Visit: Payer: Self-pay

## 2018-01-09 ENCOUNTER — Ambulatory Visit: Payer: Medicare Other | Admitting: Physician Assistant

## 2018-01-09 VITALS — BP 122/72 | HR 59 | Temp 98.0°F | Resp 16 | Ht 73.23 in | Wt 163.0 lb

## 2018-01-09 DIAGNOSIS — F410 Panic disorder [episodic paroxysmal anxiety] without agoraphobia: Secondary | ICD-10-CM

## 2018-01-09 MED ORDER — ALPRAZOLAM 0.5 MG PO TABS: 0.2500 mg | ORAL_TABLET | Freq: Every day | ORAL | 0 refills | Status: DC | PRN

## 2018-01-09 NOTE — Progress Notes (Signed)
    01/09/2018 6:27 PM   DOB: 1941-05-14 / MRN: [id number]  SUBJECTIVE:  Bradley Ramirez is a 77 y.o. male presenting for medication to help him stay calm while flying.  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  He does well with the flight and the landing.  He has No Known Allergies.   He  has a past medical history of Anxiety, Chest pain, Deafness, Dysphagia, unspecified(787.20), Esophageal spasm, Esophageal stricture, GERD (gastroesophageal reflux disease), Hiatal hernia, Hypertension, Murmur, diastolic (4132), and S/P colonoscopy.    He  reports that he quit smoking about 32 years ago. His smoking use included cigarettes. He has never used smokeless tobacco. He reports that he does not drink alcohol or use drugs. He  has no sexual activity history on file. The patient  has a past surgical history that includes Tonsillectomy; Upper gastrointestinal endoscopy; and Colonoscopy.  His family history includes Cancer in his mother; Diabetes in his maternal grandfather, maternal grandmother, and maternal uncle; Uterine cancer in his maternal aunt and mother.  Review of Systems  Psychiatric/Behavioral: The patient is nervous/anxious.     The problem list and medications were reviewed and updated by myself where necessary and exist elsewhere in the encounter.   OBJECTIVE:  BP 122/72   Pulse (!) 59   Temp 98 F (36.7 C) (Oral)   Resp 16   Ht 6' 1.23" (1.86 m)   Wt 163 lb (73.9 kg)   SpO2 96%   BMI 21.37 kg/m   Physical Exam  Constitutional: He is oriented to person, place, and time. He appears well-developed. He does not appear ill.  Eyes: Pupils are equal, round, and reactive to light. Conjunctivae and EOM are normal.  Cardiovascular: Normal rate.  Pulmonary/Chest: Effort normal.  Abdominal: He exhibits no distension.  Musculoskeletal: Normal range of motion.  Neurological: He is alert and oriented to person, place,  and time. No cranial nerve deficit. Coordination normal.  Skin: Skin is warm and dry. He is not diaphoretic.  Psychiatric: He has a normal mood and affect.  Nursing note and vitals reviewed.   No results found for this or any previous visit (from the past 72 hour(s)).  No results found.  ASSESSMENT AND PLAN:  Quandre was seen today for anxiety.  Diagnoses and all orders for this visit:  Panic: I did caution the patient on avoiding alcohol and/or narcotics while taking alprazolam.  He states he does not partake in either.  I prescribed 4 tabs today. -     ALPRAZolam (XANAX) 0.5 MG tablet; Take 0.5-1 tablets (0.25-0.5 mg total) by mouth daily as needed (Acute panic only.).    The patient is advised to call or return to clinic if he does not see an improvement in symptoms, or to seek the care of the closest emergency department if he worsens with the above plan.   Philis Fendt, MHS, PA-C [facility] 01/09/2018 6:27 PM

## 2018-01-09 NOTE — Patient Instructions (Addendum)
You cannot drink within 24 hours of taking this medication.  Do not mix with narcotics.    IF you received an x-ray today, you will receive an invoice from [facility]. Please contact [facility] at [phone] with questions or concerns regarding your invoice.   IF you received labwork today, you will receive an invoice from [place]. Please contact [facility] at [phone] with questions or concerns regarding your invoice.   Our billing staff will not be able to assist you with questions regarding bills from these companies.  You will be contacted with the lab results as soon as they are available. The fastest way to get your results is to activate your My Chart account. Instructions are located on the last page of this paperwork. If you have not heard from [place] regarding the results in 2 weeks, please contact this office.

## 2018-05-22 IMAGING — DX DG ABDOMEN 2V
2 series · 2 of 2 positions shown · non-contrast
Comparison: None.

CLINICAL DATA: Gradual decrease in bowel movements. Blood in stool.

EXAM:
ABDOMEN - 2 VIEW

[abdomen erect]
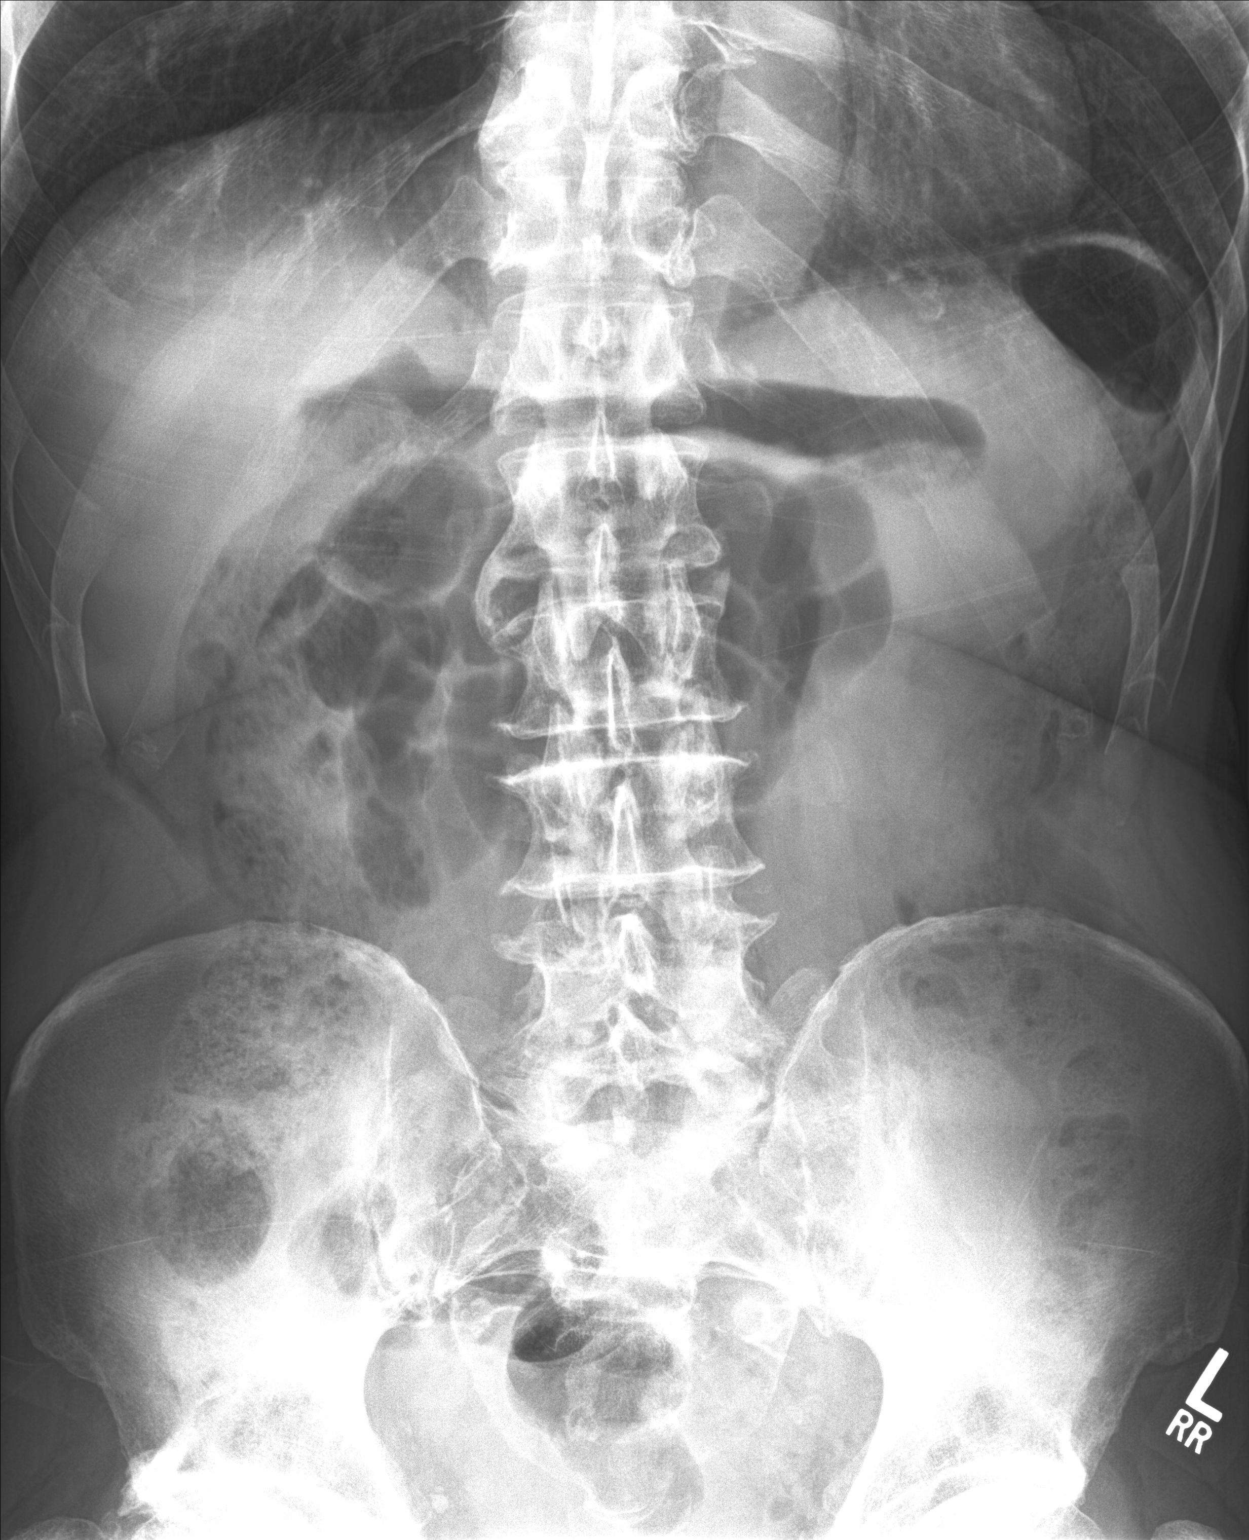

[abdomen supine]
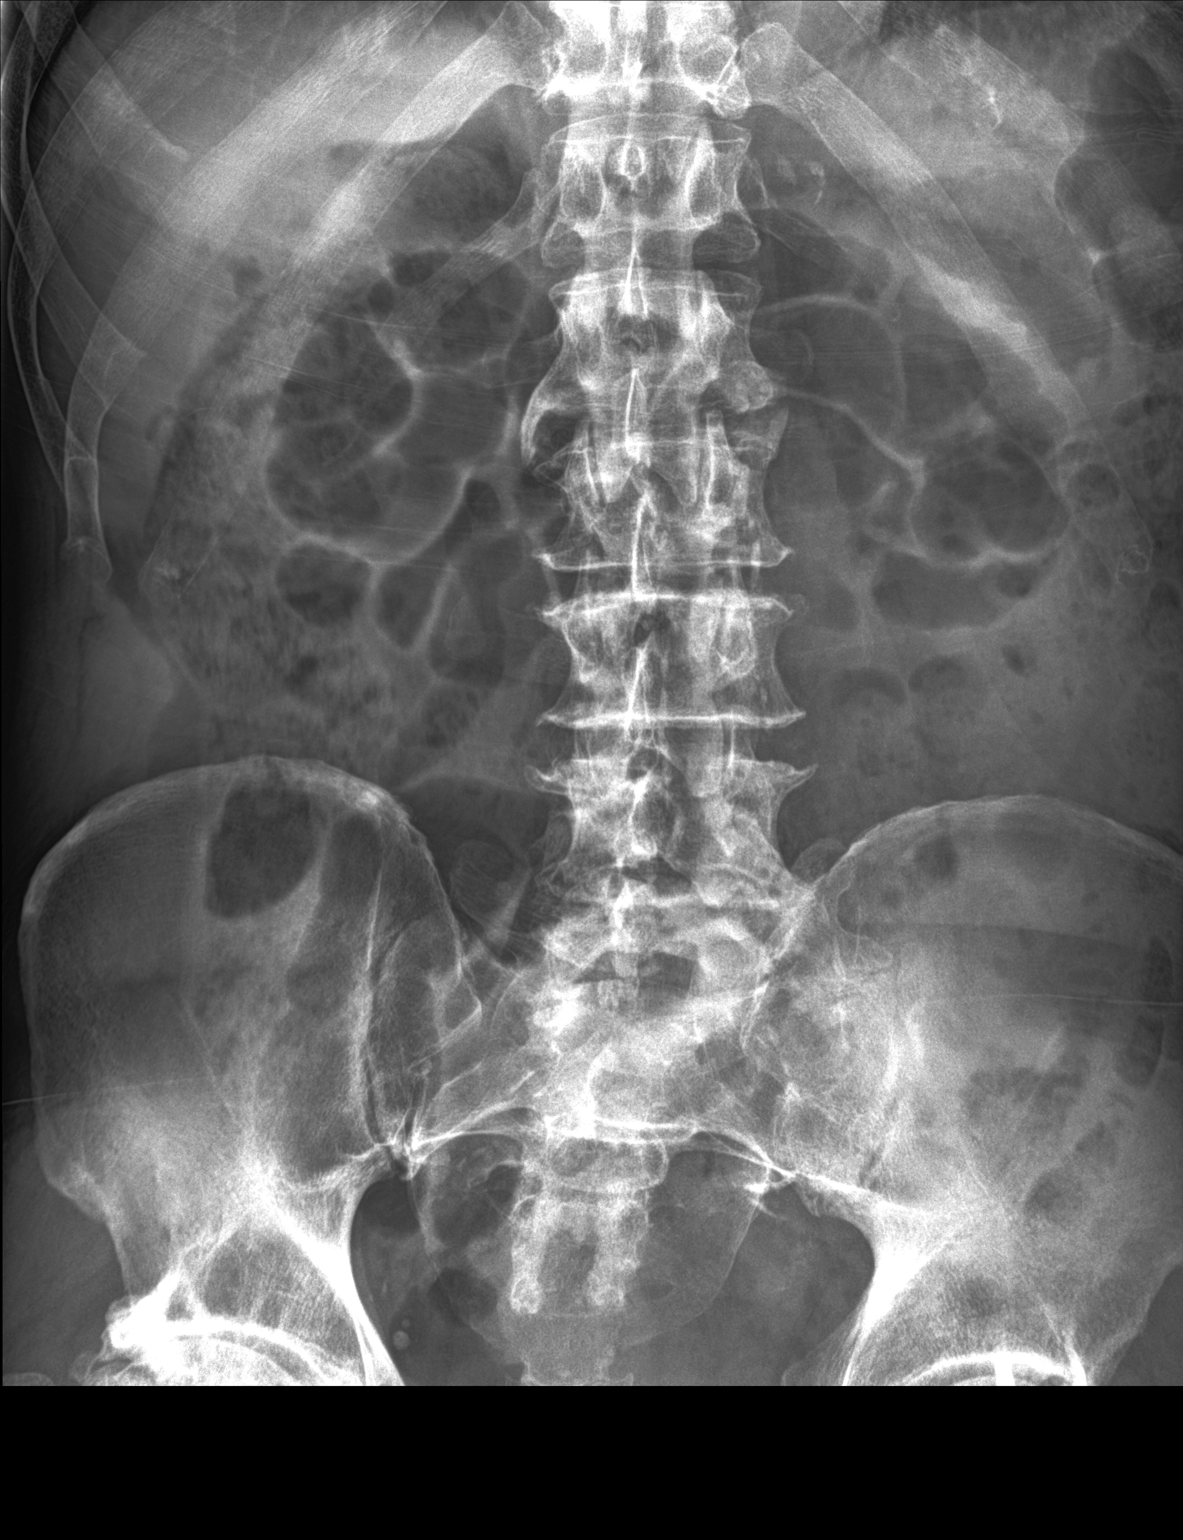

[2 of 2 positions shown; findings below may reference images not displayed]

FINDINGS: There is no bowel dilation to suggest obstruction or generalized
adynamic ileus. Increase in small bowel gas noted centrally. Mild
generalized increased stool burden throughout the colon. No free
air.

No evidence of renal or ureteral stones. Soft tissues are
unremarkable.

There are degenerative changes of the lumbar spine.
Arthropathic/degenerative changes are noted of the right hip.
IMPRESSION: 1. No acute findings. No evidence of bowel obstruction. No free air.
2. Mild increase in stool throughout the colon.

## 2018-09-30 ENCOUNTER — Ambulatory Visit (INDEPENDENT_AMBULATORY_CARE_PROVIDER_SITE_OTHER): Payer: Medicare Other | Admitting: Family Medicine

## 2018-09-30 ENCOUNTER — Ambulatory Visit: Payer: Self-pay

## 2018-09-30 VITALS — BP 157/81 | HR 65 | Ht 73.5 in | Wt 169.8 lb

## 2018-09-30 DIAGNOSIS — Z0000 Encounter for general adult medical examination without abnormal findings: Secondary | ICD-10-CM

## 2018-09-30 NOTE — Patient Instructions (Signed)
Advance Directive  Advance directives are legal documents that let you make choices ahead of time about your health care and medical treatment in case you become unable to communicate for yourself. Advance directives are a way for you to communicate your wishes to family, friends, and health care providers. This can help convey your decisions about end-of-life care if you become unable to communicate. Discussing and writing advance directives should happen over time rather than all at once. Advance directives can be changed depending on your situation and what you want, even after you have signed the advance directives. If you do not have an advance directive, some states assign family decision makers to act on your behalf based on how closely you are related to them. Each state has its own laws regarding advance directives. You may want to check with your health care provider, attorney, or state representative about the laws in your state. There are different types of advance directives, such as:  Medical power of attorney.  Living will.  Do not resuscitate (DNR) or do not attempt resuscitation (DNAR) order. Health care proxy and medical power of attorney A health care proxy, also called a health care agent, is a person who is appointed to make medical decisions for you in cases in which you are unable to make the decisions yourself. Generally, people choose someone they know well and trust to represent their preferences. Make sure to ask this person for an agreement to act as your proxy. A proxy may have to exercise judgment in the event of a medical decision for which your wishes are not known. A medical power of attorney is a legal document that names your health care proxy. Depending on the laws in your state, after the document is written, it may also need to be:  Signed.  Notarized.  Dated.  Copied.  Witnessed.  Incorporated into your medical record. You may also want to appoint  someone to manage your financial affairs in a situation in which you are unable to do so. This is called a durable power of attorney for finances. It is a separate legal document from the durable power of attorney for health care. You may choose the same person or someone different from your health care proxy to act as your agent in financial matters. If you do not appoint a proxy, or if there is a concern that the proxy is not acting in your best interests, a court-appointed guardian may be designated to act on your behalf. Living will A living will is a set of instructions documenting your wishes about medical care when you cannot express them yourself. Health care providers should keep a copy of your living will in your medical record. You may want to give a copy to family members or friends. To alert caregivers in case of an emergency, you can place a card in your wallet to let them know that you have a living will and where they can find it. A living will is used if you become:  Terminally ill.  Incapacitated.  Unable to communicate or make decisions. Items to consider in your living will include:  The use or non-use of life-sustaining equipment, such as dialysis machines and breathing machines (ventilators).  A DNR or DNAR order, which is the instruction not to use cardiopulmonary resuscitation (CPR) if breathing or heartbeat stops.  The use or non-use of tube feeding.  Withholding of food and fluids.  Comfort (palliative) care when the goal becomes comfort rather  than a cure.  Organ and tissue donation. A living will does not give instructions for distributing your money and property if you should pass away. It is recommended that you seek the advice of a lawyer when writing a will. Decisions about taxes, beneficiaries, and asset distribution will be legally binding. This process can relieve your family and friends of any concerns surrounding disputes or questions that may come up about  the distribution of your assets. DNR or DNAR A DNR or DNAR order is a request not to have CPR in the event that your heart stops beating or you stop breathing. If a DNR or DNAR order has not been made and shared, a health care provider will try to help any patient whose heart has stopped or who has stopped breathing. If you plan to have surgery, talk with your health care provider about how your DNR or DNAR order will be followed if problems occur. Summary  Advance directives are the legal documents that allow you to make choices ahead of time about your health care and medical treatment in case you become unable to communicate for yourself.  The process of discussing and writing advance directives should happen over time. You can change the advance directives, even after you have signed them.  Advance directives include DNR or DNAR orders, living wills, and designating an agent as your medical power of attorney. This information is not intended to replace advice given to you by your health care provider. Make sure you discuss any questions you have with your health care provider. Document Released: 12/17/2007 Document Revised: 07/29/2016 Document Reviewed: 07/29/2016 Elsevier Interactive Patient Education  2019 [place] Following a healthy eating pattern may help you to achieve and maintain a healthy body weight, reduce the risk of chronic disease, and live a long and productive life. It is important to follow a healthy eating pattern at an appropriate calorie level for your body. Your nutritional needs should be met primarily through food by choosing a variety of nutrient-rich foods. What are tips for following this plan? Reading food labels  Read labels and choose the following: ? Reduced or low sodium. ? Juices with 100% fruit juice. ? Foods with low saturated fats and high polyunsaturated and monounsaturated fats. ? Foods with whole grains, such as whole wheat, cracked  wheat, brown rice, and wild rice. ? Whole grains that are fortified with folic acid. This is recommended for women who are pregnant or who want to become pregnant.  Read labels and avoid the following: ? Foods with a lot of added sugars. These include foods that contain brown sugar, corn sweetener, corn syrup, dextrose, fructose, glucose, high-fructose corn syrup, honey, invert sugar, lactose, malt syrup, maltose, molasses, raw sugar, sucrose, trehalose, or turbinado sugar.  Do not eat more than the following amounts of added sugar per day:  6 teaspoons (25 g) for women.  9 teaspoons (38 g) for men. ? Foods that contain processed or refined starches and grains. ? Refined grain products, such as white flour, degermed cornmeal, white bread, and white rice. Shopping  Choose nutrient-rich snacks, such as vegetables, whole fruits, and nuts. Avoid high-calorie and high-sugar snacks, such as potato chips, fruit snacks, and candy.  Use oil-based dressings and spreads on foods instead of solid fats such as butter, stick margarine, or cream cheese.  Limit pre-made sauces, mixes, and "instant" products such as flavored rice, instant noodles, and ready-made pasta.  Try more plant-protein sources, such as tofu, tempeh,  black beans, edamame, lentils, nuts, and seeds.  Explore eating plans such as the Mediterranean diet or vegetarian diet. Cooking  Use oil to saut or stir-fry foods instead of solid fats such as butter, stick margarine, or lard.  Try baking, boiling, grilling, or broiling instead of frying.  Remove the fatty part of meats before cooking.  Steam vegetables in water or broth. Meal planning   At meals, imagine dividing your plate into fourths: ? One-half of your plate is fruits and vegetables. ? One-fourth of your plate is whole grains. ? One-fourth of your plate is protein, especially lean meats, poultry, eggs, tofu, beans, or nuts.  Include low-fat dairy as part of your  daily diet. Lifestyle  Choose healthy options in all settings, including home, work, school, restaurants, or stores.  Prepare your food safely: ? Wash your hands after handling raw meats. ? Keep food preparation surfaces clean by regularly washing with hot, soapy water. ? Keep raw meats separate from ready-to-eat foods, such as fruits and vegetables. ? Cook seafood, meat, poultry, and eggs to the recommended internal temperature. ? Store foods at safe temperatures. In general:  Keep cold foods at 55F (4.4C) or below.  Keep hot foods at 155F (60C) or above.  Keep your freezer at [facility] (-17.8C) or below.  Foods are no longer safe to eat when they have been between the temperatures of 40-155F (4.4-60C) for more than 2 hours. What foods should I eat? Fruits Aim to eat 2 cup-equivalents of fresh, canned (in natural juice), or frozen fruits each day. Examples of 1 cup-equivalent of fruit include 1 small apple, 8 large strawberries, 1 cup canned fruit,  cup dried fruit, or 1 cup 100% juice. Vegetables Aim to eat 2-3 cup-equivalents of fresh and frozen vegetables each day, including different varieties and colors. Examples of 1 cup-equivalent of vegetables include 2 medium carrots, 2 cups raw, leafy greens, 1 cup chopped vegetable (raw or cooked), or 1 medium baked potato. Grains Aim to eat 6 ounce-equivalents of whole grains each day. Examples of 1 ounce-equivalent of grains include 1 slice of bread, 1 cup ready-to-eat cereal, 3 cups popcorn, or  cup cooked rice, pasta, or cereal. Meats and other proteins Aim to eat 5-6 ounce-equivalents of protein each day. Examples of 1 ounce-equivalent of protein include 1 egg, 1/2 cup nuts or seeds, or 1 tablespoon (16 g) peanut butter. A cut of meat or fish that is the size of a deck of cards is about 3-4 ounce-equivalents.  Of the protein you eat each week, try to have at least 8 ounces come from seafood. This includes salmon, trout, herring, and  anchovies. Dairy Aim to eat 3 cup-equivalents of fat-free or low-fat dairy each day. Examples of 1 cup-equivalent of dairy include 1 cup (240 mL) milk, 8 ounces (250 g) yogurt, 1 ounces (44 g) natural cheese, or 1 cup (240 mL) fortified soy milk. Fats and oils  Aim for about 5 teaspoons (21 g) per day. Choose monounsaturated fats, such as canola and olive oils, avocados, peanut butter, and most nuts, or polyunsaturated fats, such as sunflower, corn, and soybean oils, walnuts, pine nuts, sesame seeds, sunflower seeds, and flaxseed. Beverages  Aim for six 8-oz glasses of water per day. Limit coffee to three to five 8-oz cups per day.  Limit caffeinated beverages that have added calories, such as soda and energy drinks.  Limit alcohol intake to no more than 1 drink a day for nonpregnant women and 2 drinks a day  for men. One drink equals 12 oz of beer (355 mL), 5 oz of wine (148 mL), or 1 oz of hard liquor (44 mL). Seasoning and other foods  Avoid adding excess amounts of salt to your foods. Try flavoring foods with herbs and spices instead of salt.  Avoid adding sugar to foods.  Try using oil-based dressings, sauces, and spreads instead of solid fats. This information is based on general [place] nutrition guidelines. For more information, visit choosemyplate.gov. Exact amounts may vary based on your nutrition needs. Summary  A healthy eating plan may help you to maintain a healthy weight, reduce the risk of chronic diseases, and stay active throughout your life.  Plan your meals. Make sure you eat the right portions of a variety of nutrient-rich foods.  Try baking, boiling, grilling, or broiling instead of frying.  Choose healthy options in all settings, including home, work, school, restaurants, or stores. This information is not intended to replace advice given to you by your health care provider. Make sure you discuss any questions you have with your health care provider. Document  Released: 12/22/2017 Document Revised: 12/22/2017 Document Reviewed: 12/22/2017 Elsevier Interactive Patient Education  2019 [employer]  

## 2018-09-30 NOTE — Progress Notes (Signed)
Works part-time at [employer] with wife. Children  3 girls One boy all live in [place] kids/ [place] grand kids  Total 17    Breakfast: coffee, danish Lunch: does not typically eat lunch Dinner: chicken , vegetables pinto beans , green beans, corn.  Limits his stair climbing due to anxiety  Home is one level .Marland Kitchen   Shingrix was about 3 years ago.   pneumococcal 13 2015 pneumococcal 23 2016  No living, educated on living will.  Goals:  Eating healthier.  Dr. Gershon [facility] doctor Colonoscopy  Up to date

## 2018-10-03 NOTE — Progress Notes (Signed)
Note reviewed, and agree with documentation and plan.  

## 2018-10-20 ENCOUNTER — Other Ambulatory Visit: Payer: Self-pay | Admitting: Family Medicine

## 2018-10-20 ENCOUNTER — Other Ambulatory Visit: Payer: Self-pay | Admitting: *Deleted

## 2018-10-20 DIAGNOSIS — I10 Essential (primary) hypertension: Secondary | ICD-10-CM

## 2018-10-20 DIAGNOSIS — J4520 Mild intermittent asthma, uncomplicated: Secondary | ICD-10-CM

## 2018-10-20 NOTE — Telephone Encounter (Signed)
Requested medication (s) are due for refill today: yes  Requested medication (s) are on the active medication list: yes    Last refill: 10/09/17  #90  3 refills  Future visit scheduled no  Notes to clinic:not delegated  Requested Prescriptions  Pending Prescriptions Disp Refills   diltiazem (CARDIZEM CD) 180 MG 24 hr capsule [Pharmacy Med Name: DILTIAZEM 24H ER(CD) 180 MG CP] 90 capsule 3    Sig: TAKE 1 CAPSULE BY MOUTH EVERY DAY     Off-Protocol Failed - 10/20/2018 12:11 AM      Failed - Medication not assigned to a protocol, review manually.      Passed - Valid encounter within last 12 months    Recent Outpatient Visits          2 weeks ago Medicare annual wellness visit, subsequent   [facility], Ranell Patrick, MD   9 months ago Panic   [facility] [place], PA-C   11 months ago Constipation, unspecified constipation type   [facility], Gelene Mink, PA-C   1 year ago Essential hypertension   [facility], Ranell Patrick, MD   2 years ago Medicare annual wellness visit, subsequent   [facility], Ranell Patrick, MD

## 2018-12-01 ENCOUNTER — Other Ambulatory Visit: Payer: Self-pay

## 2018-12-01 ENCOUNTER — Ambulatory Visit: Payer: Medicare Other | Admitting: Family Medicine

## 2018-12-01 ENCOUNTER — Encounter: Payer: Self-pay | Admitting: Family Medicine

## 2018-12-01 VITALS — BP 138/82 | HR 58 | Temp 97.6°F | Resp 16 | Ht 74.0 in | Wt 166.4 lb

## 2018-12-01 DIAGNOSIS — R1013 Epigastric pain: Secondary | ICD-10-CM | POA: Diagnosis not present

## 2018-12-01 DIAGNOSIS — A048 Other specified bacterial intestinal infections: Secondary | ICD-10-CM

## 2018-12-01 DIAGNOSIS — Z8619 Personal history of other infectious and parasitic diseases: Secondary | ICD-10-CM

## 2018-12-01 DIAGNOSIS — K5900 Constipation, unspecified: Secondary | ICD-10-CM

## 2018-12-01 DIAGNOSIS — R12 Heartburn: Secondary | ICD-10-CM

## 2018-12-01 MED ORDER — OMEPRAZOLE 20 MG PO CPDR: 20.0000 mg | DELAYED_RELEASE_CAPSULE | Freq: Every day | ORAL | 1 refills | Status: DC

## 2018-12-01 NOTE — Progress Notes (Signed)
Subjective:    Patient ID: Bradley Ramirez, male    DOB: [birth year], 78 y.o.   MRN: [id number]  HPI Bradley Ramirez is a 78 y.o. male Presents today for: Chief Complaint  Patient presents with  . stomach issus    was seden here last yr for stomach issue had bacteria in the stomach and here today to see if its better or wosre  . bad breathe    daughter stated he had bad breathe thinking maybe since he is out of the bacteria medeication this is the reason for the bad breathe   Evaluated in February 2019 with complaints of blood in the stool.  Negative FOBT in the office, normal CBC.  Abdominal x-ray showed increased stool burden at that time.  Treated for constipation and discussed constipation prevention.  Was treated with MiraLAX.  Colonoscopy 12/09/2016 with polyps.  Advised to call to determine next scheduled colonoscopy.  Positive H. pylori breath test February 2019.  Hemoccult card negative.  Treated with omeprazole, clarithromycin, amoxicillin, metronidazole twice per day for 10 days.  Plan for repeat H. pylori breath test after treatment.  History of hiatal hernia for problems, has been treated with Protonix for heartburn in the past by Dr. Deatra Ina, had endoscopy 2010.  Today complains of increased burping and acid reflux. Some bad breath at times.   No wt loss, night sweats, blood in stool or vomiting.  No alcohol.  Decaf coffee.  No GI eval in past year.  Occasional constipation. Comes and goes. Has miralax if needed.  Soaking false teeth daily.   Patient Active Problem List   Diagnosis Date Noted  . Constipation 11/24/2017  . Deaf 10/28/2013  . CARDIOMYOPATHY 04/20/2009  . HYPERTENSION, UNSPECIFIED 04/19/2009  . HIATAL HERNIA, HX OF 04/19/2009   Past Medical History:  Diagnosis Date  . Anxiety   . Chest pain    per pt/ was resolved/ was Ai per pt.  . Deafness    Pt does sign language!  Marland Kitchen Dysphagia, unspecified(787.20)   . Esophageal spasm   . Esophageal  stricture   . GERD (gastroesophageal reflux disease)    no meds  . Hiatal hernia   . Hypertension   . Murmur, diastolic 6384   U/S was ok per pt.  . S/P colonoscopy    Past Surgical History:  Procedure Laterality Date  . COLONOSCOPY    . TONSILLECTOMY     as a child  . UPPER GASTROINTESTINAL ENDOSCOPY     No Known Allergies Prior to Admission medications   Medication Sig Start Date End Date Taking? Authorizing Provider  ALPRAZolam Duanne Moron) 0.5 MG tablet Take 0.5-1 tablets (0.25-0.5 mg total) by mouth daily as needed (Acute panic only.). 01/09/18  Yes Tereasa Coop, PA-C  aspirin 81 MG tablet Take 81 mg by mouth daily.     Yes [provider]  diltiazem (CARDIZEM CD) 180 MG 24 hr capsule TAKE 1 CAPSULE BY MOUTH EVERY DAY 10/20/18  Yes Wendie Agreste, MD  [place] HFA 44 MCG/ACT inhaler TAKE 2 PUFFS BY MOUTH TWICE A DAY 10/20/18  Yes Wendie Agreste, MD  omeprazole (PRILOSEC) 20 MG capsule Take 1 capsule (20 mg total) by mouth 2 (two) times daily before a meal. 11/03/17  Yes McVey, Gelene Mink, PA-C  polyethylene glycol powder (GLYCOLAX/MIRALAX) powder Take 17 g by mouth 2 (two) times daily as needed. 10/31/17  Yes McVey, Gelene Mink, PA-C   Social History   Socioeconomic History  . Marital status:  Married    Spouse name: Not on file  . Number of children: 4  . Years of education: Not on file  . Highest education level: Some college, no degree  Occupational History  . Occupation: retired    Fish farm manager: [employer]  Social Needs  . Financial resource strain: Not hard at all  . Food insecurity:    Worry: Never true    Inability: Never true  . Transportation needs:    Medical: No    Non-medical: No  Tobacco Use  . Smoking status: Former Smoker    Types: Cigarettes    Last attempt to quit: 1987    Years since quitting: 33.2  . Smokeless tobacco: Never Used  Substance and Sexual Activity  . Alcohol use: No    Comment: quit 1987  . Drug use: No  . Sexual  activity: Not on file  Lifestyle  . Physical activity:    Days per week: 0 days    Minutes per session: 0 min  . Stress: Not at all  Relationships  . Social connections:    Talks on phone: More than three times a week    Gets together: More than three times a week    Attends religious service: More than 4 times per year    Active member of club or organization: Yes    Attends meetings of clubs or organizations: More than 4 times per year    Relationship status: Married  . Intimate partner violence:    Fear of current or ex partner: No    Emotionally abused: No    Physically abused: No    Forced sexual activity: No  Other Topics Concern  . Not on file  Social History Narrative   Married   Works as custodian   Education:  [employer] Per HPI.     Objective:   Physical Exam Vitals signs and nursing note reviewed.  Constitutional:      Appearance: He is well-developed.  HENT:     Head: Normocephalic and atraumatic.  Eyes:     Pupils: Pupils are equal, round, and reactive to light.  Neck:     Vascular: No carotid bruit or JVD.  Cardiovascular:     Rate and Rhythm: Normal rate and regular rhythm.     Heart sounds: Normal heart sounds. No murmur.  Pulmonary:     Effort: Pulmonary effort is normal.     Breath sounds: Normal breath sounds. No rales.  Abdominal:     Tenderness: There is abdominal tenderness (min epigastric, no rebound/guarding. ). There is no guarding or rebound.  Skin:    General: Skin is warm and dry.  Neurological:     Mental Status: He is alert and oriented to person, place, and time.    Vitals:   12/01/18 1639  BP: 138/82  Pulse: (!) 58  Resp: 16  Temp: 97.6 F (36.4 C)  TempSrc: Oral  SpO2: 98%  Weight: 166 lb 6.4 oz (75.5 kg)  Height: 6\' 2"  (1.88 m)       Assessment & Plan:    [name] is a 78 y.o. male History of Helicobacter pylori infection - Plan: H. pylori breath test Heartburn - Plan: H. pylori  breath test Abdominal pain, epigastric - Plan: H. pylori breath test, CBC, Comprehensive metabolic panel H. pylori infection - Plan: omeprazole (PRILOSEC) 20 MG capsule  -Suspect flare of heartburn off PPI.  Prior H. pylori infection, treated with antibiotics.  Repeat H. pylori breath test, CBC, CMP given some epigastric discomfort.  Less likely PUD but ER/RTC precautions if acute worsening or not improving with restart of PPI.  Trigger foods discussed on handout as well.  Additionally recommended follow-up with his gastroenterologist.  Constipation, unspecified constipation type  -Episodic MiraLAX okay to still use, fiber/fluid in the diet for constipation prevention, previously handout given.  RTC precautions given.  Sign language interpreter present with understanding expressed. Meds ordered this encounter  Medications  . omeprazole (PRILOSEC) 20 MG capsule    Sig: Take 1 capsule (20 mg total) by mouth daily.    Dispense:  30 capsule    Refill:  1   Patient Instructions    Restart omeprazole once per day. I will let you know if any new medicine is needed. If heartburn is not improving in next 2 weeks - return for recheck.   I would also recommend following up with stomach specialist.  Let me know if a referral is needed.   miralax if needed for constipation.   I will check some bloodwork, but if any worsening pain, let me know.    Food Choices for Gastroesophageal Reflux Disease, Adult When you have gastroesophageal reflux disease (GERD), the foods you eat and your eating habits are very important. Choosing the right foods can help ease your discomfort. Think about working with a nutrition specialist (dietitian) to help you make good choices. What are tips for following this plan?  Meals  Choose healthy foods that are low in fat, such as fruits, vegetables, whole grains, low-fat dairy products, and lean meat, fish, and poultry.  Eat small meals often instead of 3 large meals a  day. Eat your meals slowly, and in a place where you are relaxed. Avoid bending over or lying down until 2-3 hours after eating.  Avoid eating meals 2-3 hours before bed.  Avoid drinking a lot of liquid with meals.  Cook foods using methods other than frying. Bake, grill, or broil food instead.  Avoid or limit: ? Chocolate. ? Peppermint or spearmint. ? Alcohol. ? Pepper. ? Black and decaffeinated coffee. ? Black and decaffeinated tea. ? Bubbly (carbonated) soft drinks. ? Caffeinated energy drinks and soft drinks.  Limit high-fat foods such as: ? Fatty meat or fried foods. ? Whole milk, cream, butter, or ice cream. ? Nuts and nut butters. ? Pastries, donuts, and sweets made with butter or shortening.  Avoid foods that cause symptoms. These foods may be different for everyone. Common foods that cause symptoms include: ? Tomatoes. ? Oranges, lemons, and limes. ? Peppers. ? Spicy food. ? Onions and garlic. ? Vinegar. Lifestyle  Maintain a healthy weight. Ask your doctor what weight is healthy for you. If you need to lose weight, work with your doctor to do so safely.  Exercise for at least 30 minutes for 5 or more days each week, or as told by your doctor.  Wear loose-fitting clothes.  Do not smoke. If you need help quitting, ask your doctor.  Sleep with the head of your bed higher than your feet. Use a wedge under the mattress or blocks under the bed frame to raise the head of the bed. Summary  When you have gastroesophageal reflux disease (GERD), food and lifestyle choices are very important in easing your symptoms.  Eat small meals often instead of 3 large meals a day. Eat your meals slowly, and in a place where you are relaxed.  Limit high-fat foods such as fatty  meat or fried foods.  Avoid bending over or lying down until 2-3 hours after eating.  Avoid peppermint and spearmint, caffeine, alcohol, and chocolate. This information is not intended to replace advice  given to you by your health care provider. Make sure you discuss any questions you have with your health care provider. Document Released: 03/10/2012 Document Revised: 10/15/2016 Document Reviewed: 10/15/2016 Elsevier Interactive Patient Education  [employer].    If you have lab work done today you will be contacted with your lab results within the next 2 weeks.  If you have not heard from [place] then please contact us. The fastest way to get your results is to register for My Chart.   IF you received an x-ray today, you will receive an invoice from [facility]. Please contact [facility] at [phone] with questions or concerns regarding your invoice.   IF you received labwork today, you will receive an invoice from [place]. Please contact [facility] at [phone] with questions or concerns regarding your invoice.   Our billing staff will not be able to assist you with questions regarding bills from these companies.  You will be contacted with the lab results as soon as they are available. The fastest way to get your results is to activate your My Chart account. Instructions are located on the last page of this paperwork. If you have not heard from [place] regarding the results in 2 weeks, please contact this office.       Signed,   [name], MD [facility].  12/01/18 6:28 PM

## 2018-12-01 NOTE — Patient Instructions (Addendum)
Restart omeprazole once per day. I will let you know if any new medicine is needed. If heartburn is not improving in next 2 weeks - return for recheck.   I would also recommend following up with stomach specialist.  Let me know if a referral is needed.   miralax if needed for constipation.   I will check some bloodwork, but if any worsening pain, let me know.    Food Choices for Gastroesophageal Reflux Disease, Adult When you have gastroesophageal reflux disease (GERD), the foods you eat and your eating habits are very important. Choosing the right foods can help ease your discomfort. Think about working with a nutrition specialist (dietitian) to help you make good choices. What are tips for following this plan?  Meals  Choose healthy foods that are low in fat, such as fruits, vegetables, whole grains, low-fat dairy products, and lean meat, fish, and poultry.  Eat small meals often instead of 3 large meals a day. Eat your meals slowly, and in a place where you are relaxed. Avoid bending over or lying down until 2-3 hours after eating.  Avoid eating meals 2-3 hours before bed.  Avoid drinking a lot of liquid with meals.  Cook foods using methods other than frying. Bake, grill, or broil food instead.  Avoid or limit: ? Chocolate. ? Peppermint or spearmint. ? Alcohol. ? Pepper. ? Black and decaffeinated coffee. ? Black and decaffeinated tea. ? Bubbly (carbonated) soft drinks. ? Caffeinated energy drinks and soft drinks.  Limit high-fat foods such as: ? Fatty meat or fried foods. ? Whole milk, cream, butter, or ice cream. ? Nuts and nut butters. ? Pastries, donuts, and sweets made with butter or shortening.  Avoid foods that cause symptoms. These foods may be different for everyone. Common foods that cause symptoms include: ? Tomatoes. ? Oranges, lemons, and limes. ? Peppers. ? Spicy food. ? Onions and garlic. ? Vinegar. Lifestyle  Maintain a healthy weight. Ask your  doctor what weight is healthy for you. If you need to lose weight, work with your doctor to do so safely.  Exercise for at least 30 minutes for 5 or more days each week, or as told by your doctor.  Wear loose-fitting clothes.  Do not smoke. If you need help quitting, ask your doctor.  Sleep with the head of your bed higher than your feet. Use a wedge under the mattress or blocks under the bed frame to raise the head of the bed. Summary  When you have gastroesophageal reflux disease (GERD), food and lifestyle choices are very important in easing your symptoms.  Eat small meals often instead of 3 large meals a day. Eat your meals slowly, and in a place where you are relaxed.  Limit high-fat foods such as fatty meat or fried foods.  Avoid bending over or lying down until 2-3 hours after eating.  Avoid peppermint and spearmint, caffeine, alcohol, and chocolate. This information is not intended to replace advice given to you by your health care provider. Make sure you discuss any questions you have with your health care provider. Document Released: 03/10/2012 Document Revised: 10/15/2016 Document Reviewed: 10/15/2016 Elsevier Interactive Patient Education  [employer].    If you have lab work done today you will be contacted with your lab results within the next 2 weeks.  If you have not heard from [place] then please contact us. The fastest way to get your results is to register for My Chart.   IF you  received an x-ray today, you will receive an invoice from [facility]. Please contact [facility] at [phone] with questions or concerns regarding your invoice.   IF you received labwork today, you will receive an invoice from [place]. Please contact [facility] at [phone] with questions or concerns regarding your invoice.   Our billing staff will not be able to assist you with questions regarding bills from these companies.  You will be contacted with the lab  results as soon as they are available. The fastest way to get your results is to activate your My Chart account. Instructions are located on the last page of this paperwork. If you have not heard from [place] regarding the results in 2 weeks, please contact this office.

## 2018-12-02 LAB — COMPREHENSIVE METABOLIC PANEL
ALK PHOS: 74 IU/L (ref 39–117)
ALT: 11 IU/L (ref 0–44)
AST: 17 IU/L (ref 0–40)
Albumin/Globulin Ratio: 1.7 (ref 1.2–2.2)
Albumin: 4.5 g/dL (ref 3.7–4.7)
BUN/Creatinine Ratio: 16 (ref 10–24)
BUN: 20 mg/dL (ref 8–27)
Bilirubin Total: 0.5 mg/dL (ref 0.0–1.2)
CHLORIDE: 103 mmol/L (ref 96–106)
CO2: 22 mmol/L (ref 20–29)
Calcium: 9.3 mg/dL (ref 8.6–10.2)
Creatinine, Ser: 1.23 mg/dL (ref 0.76–1.27)
GFR calc Af Amer: 65 mL/min/{1.73_m2} (ref >59)
GFR calc non Af Amer: 56 mL/min/{1.73_m2} — ABNORMAL LOW (ref >59)
Globulin, Total: 2.6 g/dL (ref 1.5–4.5)
Glucose: 89 mg/dL (ref 65–99)
POTASSIUM: 4.1 mmol/L (ref 3.5–5.2)
Sodium: 141 mmol/L (ref 134–144)
Total Protein: 7.1 g/dL (ref 6.0–8.5)

## 2018-12-02 LAB — H. PYLORI BREATH TEST: H pylori Breath Test: NEGATIVE

## 2018-12-02 LAB — CBC
Hematocrit: 34 % — ABNORMAL LOW (ref 37.5–51.0)
Hemoglobin: 11.7 g/dL — ABNORMAL LOW (ref 13.0–17.7)
MCH: 30.9 pg (ref 26.6–33.0)
MCHC: 34.4 g/dL (ref 31.5–35.7)
MCV: 90 fL (ref 79–97)
Platelets: 227 10*3/uL (ref 150–450)
RBC: 3.79 x10E6/uL — ABNORMAL LOW (ref 4.14–5.80)
RDW: 12.8 % (ref 11.6–15.4)
WBC: 5.5 10*3/uL (ref 3.4–10.8)

## 2018-12-07 ENCOUNTER — Encounter: Payer: Self-pay | Admitting: Radiology

## 2018-12-23 ENCOUNTER — Other Ambulatory Visit: Payer: Self-pay | Admitting: Family Medicine

## 2018-12-23 DIAGNOSIS — A048 Other specified bacterial intestinal infections: Secondary | ICD-10-CM

## 2019-01-11 ENCOUNTER — Other Ambulatory Visit: Payer: Self-pay | Admitting: Family Medicine

## 2019-01-11 DIAGNOSIS — I10 Essential (primary) hypertension: Secondary | ICD-10-CM

## 2019-01-12 ENCOUNTER — Other Ambulatory Visit: Payer: Self-pay | Admitting: Family Medicine

## 2019-01-12 DIAGNOSIS — J4520 Mild intermittent asthma, uncomplicated: Secondary | ICD-10-CM

## 2019-01-12 NOTE — Telephone Encounter (Signed)
Requested Prescriptions  Pending Prescriptions Disp Refills  . [place] HFA 44 MCG/ACT inhaler [Pharmacy Med Name: [place] HFA 44 MCG INHALER] 31.8 Inhaler 1    Sig: INHALE 2 PUFFS BY MOUTH TWICE A DAY     Pulmonology:  Corticosteroids Passed - 01/12/2019 11:31 AM      Passed - Valid encounter within last 12 months    Recent Outpatient Visits          1 month ago History of Helicobacter pylori infection   [facility], Ranell Patrick, MD   3 months ago Medicare annual wellness visit, subsequent   [facility], Ranell Patrick, MD   1 year ago Panic   [facility], PA-C   1 year ago Constipation, unspecified constipation type   [facility], Gelene Mink, PA-C   1 year ago Essential hypertension   [facility], Ranell Patrick, MD

## 2019-01-14 ENCOUNTER — Other Ambulatory Visit: Payer: Self-pay | Admitting: Family Medicine

## 2019-01-14 DIAGNOSIS — A048 Other specified bacterial intestinal infections: Secondary | ICD-10-CM

## 2019-04-04 ENCOUNTER — Other Ambulatory Visit: Payer: Self-pay | Admitting: Family Medicine

## 2019-04-04 DIAGNOSIS — I10 Essential (primary) hypertension: Secondary | ICD-10-CM

## 2019-04-04 NOTE — Telephone Encounter (Signed)
.   Requested Prescriptions  Pending Prescriptions Disp Refills  . diltiazem (CARDIZEM CD) 180 MG 24 hr capsule [Pharmacy Med Name: DILTIAZEM 24H ER(CD) 180 MG CP] 30 capsule 0    Sig: TAKE 1 CAPSULE BY MOUTH EVERY DAY     Cardiovascular:  Calcium Channel Blockers Failed - 04/04/2019 12:07 AM      Failed - Valid encounter within last 6 months    Recent Outpatient Visits          4 months ago History of Helicobacter pylori infection   [facility], Ranell Patrick, MD   6 months ago Medicare annual wellness visit, subsequent   [facility], Ranell Patrick, MD   1 year ago Panic   [facility], PA-C   1 year ago Constipation, unspecified constipation type   [facility], Gelene Mink, PA-C   1 year ago Essential hypertension   [facility], MD      Future Appointments            In 1 month Hochrein, Jeneen Rinks, MD [facility], [place] BP in normal range    BP Readings from Last 1 Encounters:  12/01/18 138/82

## 2019-04-13 ENCOUNTER — Other Ambulatory Visit: Payer: Self-pay | Admitting: Family Medicine

## 2019-04-13 DIAGNOSIS — A048 Other specified bacterial intestinal infections: Secondary | ICD-10-CM

## 2019-04-23 ENCOUNTER — Encounter: Payer: Self-pay | Admitting: Family Medicine

## 2019-04-23 ENCOUNTER — Telehealth (INDEPENDENT_AMBULATORY_CARE_PROVIDER_SITE_OTHER): Payer: Medicare Other | Admitting: Family Medicine

## 2019-04-23 ENCOUNTER — Other Ambulatory Visit: Payer: Self-pay

## 2019-04-23 VITALS — Ht 74.0 in | Wt 162.0 lb

## 2019-04-23 DIAGNOSIS — K219 Gastro-esophageal reflux disease without esophagitis: Secondary | ICD-10-CM

## 2019-04-23 DIAGNOSIS — J4520 Mild intermittent asthma, uncomplicated: Secondary | ICD-10-CM | POA: Diagnosis not present

## 2019-04-23 DIAGNOSIS — D649 Anemia, unspecified: Secondary | ICD-10-CM | POA: Diagnosis not present

## 2019-04-23 DIAGNOSIS — I10 Essential (primary) hypertension: Secondary | ICD-10-CM

## 2019-04-23 MED ORDER — DILTIAZEM HCL ER COATED BEADS 180 MG PO CP24: 180.0000 mg | ORAL_CAPSULE | Freq: Every day | ORAL | 1 refills | Status: DC

## 2019-04-23 MED ORDER — OMEPRAZOLE 20 MG PO CPDR: DELAYED_RELEASE_CAPSULE | ORAL | 1 refills | Status: DC

## 2019-04-23 MED ORDER — FLOVENT HFA 44 MCG/ACT IN AERO: 2.0000 | INHALATION_SPRAY | Freq: Two times a day (BID) | RESPIRATORY_TRACT | 5 refills | Status: DC

## 2019-04-23 NOTE — Progress Notes (Signed)
Virtual Visit via Telephone Note  I connected with Bradley Ramirez on 04/23/19 at 6:06 PM by telephone and verified that I am speaking with the correct person using two identifiers.  Sign language interpreter through phone used.    I discussed the limitations, risks, security and privacy concerns of performing an evaluation and management service by telephone and the availability of in person appointments. I also discussed with the patient that there may be a patient responsible charge related to this service. The patient expressed understanding and agreed to proceed, consent obtained  Chief complaint: Med refills.   History of Present Illness: Bradley Ramirez is a 78 y.o. male  Heartburn: Evaluated in March, suspected flare off his PPI at that time.  Previous H. pylori infection, treated with antibiotics.  Restarted omeprazole 20 mg daily and recommended follow-up with gastroenterology, prior Dr. Deatra Ina.  H. pylori testing was negative.  Reassuring CMP with normal LFTs.  Taking omeprazole QD to QOD. Symptoms controlled when taken daily.  Has not followed up yet with GI.    Anemia: Mildly low last visit, stable from previous readings.  Normocytic.  Colonoscopy December 09, 2016 with polyps, recommended he discuss follow-up interval with gastroenterology -Dr. Havery Moros.  Lab Results  Component Value Date   WBC 5.5 12/01/2018   HGB 11.7 (L) 12/01/2018   HCT 34.0 (L) 12/01/2018   MCV 90 12/01/2018   PLT 227 12/01/2018    Hypertension: BP Readings from Last 3 Encounters:  12/01/18 138/82  09/30/18 (!) 157/81  01/09/18 122/72   Lab Results  Component Value Date   CREATININE 1.23 12/01/2018  Cardizem CD 180 mg daily. No new side effects. Constitutional: Negative for fatigue and unexpected weight change.  Eyes: Negative for visual disturbance.  Respiratory: Negative for cough, chest tightness and shortness of breath.   Cardiovascular: Negative for chest pain, palpitations  and leg swelling.  Gastrointestinal: Negative for abdominal pain and blood in stool.  Neurological: Negative for dizziness, light-headedness and headaches.    Asthma: Has used Flovent 44 mcg inhaler 2 puffs twice daily. - using 2 puffs in the morning only and sx's controlled at that dose.   No known exposure to Covid and no symptoms.  Has been wearing mask., washing hands, social distancing.     Patient Active Problem List   Diagnosis Date Noted  . Constipation 11/24/2017  . Deaf 10/28/2013  . CARDIOMYOPATHY 04/20/2009  . HYPERTENSION, UNSPECIFIED 04/19/2009  . HIATAL HERNIA, HX OF 04/19/2009   Past Medical History:  Diagnosis Date  . Anxiety   . Chest pain    per pt/ was resolved/ was [place] per pt.  . Deafness    Pt does sign language!  Marland Kitchen Dysphagia, unspecified(787.20)   . Esophageal spasm   . Esophageal stricture   . GERD (gastroesophageal reflux disease)    no meds  . Hiatal hernia   . Hypertension   . Murmur, diastolic 8177   U/S was ok per pt.  . S/P colonoscopy    Past Surgical History:  Procedure Laterality Date  . COLONOSCOPY    . TONSILLECTOMY     as a child  . UPPER GASTROINTESTINAL ENDOSCOPY     No Known Allergies Prior to Admission medications   Medication Sig Start Date End Date Taking? Authorizing Provider  ALPRAZolam Duanne Moron) 0.5 MG tablet Take 0.5-1 tablets (0.25-0.5 mg total) by mouth daily as needed (Acute panic only.). 01/09/18  Yes Tereasa Coop, PA-C  aspirin 81 MG tablet Take 81 mg  by mouth daily.     Yes [provider]  diltiazem (CARDIZEM CD) 180 MG 24 hr capsule TAKE 1 CAPSULE BY MOUTH EVERY DAY 04/04/19  Yes Wendie Agreste, MD  FLOVENT HFA 44 MCG/ACT inhaler INHALE 2 PUFFS BY MOUTH TWICE A DAY 01/12/19  Yes Wendie Agreste, MD  omeprazole (PRILOSEC) 20 MG capsule TAKE 1 CAPSULE BY MOUTH EVERY DAY 04/13/19  Yes Wendie Agreste, MD  polyethylene glycol powder (GLYCOLAX/MIRALAX) powder Take 17 g by mouth 2 (two) times daily as  needed. 10/31/17  Yes McVey, Gelene Mink, PA-C   Social History   Socioeconomic History  . Marital status: Married    Spouse name: Not on file  . Number of children: 4  . Years of education: Not on file  . Highest education level: Some college, no degree  Occupational History  . Occupation: retired    Fish farm manager: [employer]  Social Needs  . Financial resource strain: Not hard at all  . Food insecurity    Worry: Never true    Inability: Never true  . Transportation needs    Medical: No    Non-medical: No  Tobacco Use  . Smoking status: Former Smoker    Types: Cigarettes    Quit date: 1987    Years since quitting: 33.6  . Smokeless tobacco: Never Used  Substance and Sexual Activity  . Alcohol use: No    Comment: quit 1987  . Drug use: No  . Sexual activity: Not on file  Lifestyle  . Physical activity    Days per week: 0 days    Minutes per session: 0 min  . Stress: Not at all  Relationships  . Social connections    Talks on phone: More than three times a week    Gets together: More than three times a week    Attends religious service: More than 4 times per year    Active member of club or organization: Yes    Attends meetings of clubs or organizations: More than 4 times per year    Relationship status: Married  . Intimate partner violence    Fear of current or ex partner: No    Emotionally abused: No    Physically abused: No    Forced sexual activity: No  Other Topics Concern  . Not on file  Social History Narrative   Married   Works as custodian   Education:  High school     Observations/Objective: Vitals:   04/23/19 1703  Weight: 162 lb (73.5 kg)  Height: 6\' 2"  (1.88 m)  home vitals.   Did not speak directly to patient given hearing deficit, sign language interpreter through phone call providing history.  Assessment and Plan: Essential hypertension - Plan: diltiazem (CARDIZEM CD) 180 MG 24 hr capsule,   - tolerating meds. Plan for in person visit in  next 3 months with lab work at that time  Gastroesophageal reflux disease, esophagitis presence not specified - Plan: omeprazole (PRILOSEC) 20 MG capsule  -Continue omeprazole daily, stressed importance of follow-up with gastroenterology.   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  Option of twice daily dosing.  Anemia, unspecified type - Plan:   -Previously stable from prior readings, but with persistent anemia did recommend follow-up with gastroenterology to determine timing of repeat colonoscopy.  Repeat CBC at lab work in next few months.  Sooner if new symptoms  Follow Up Instructions: Patient Instructions   I refilled the omeprazole for now, but please call and schedule follow up appointment with gastroenterologist, as blood counts were still low at last visit.   Continue same dose of blood pressure med and asthma inhaler.   Please be seen in next 3 months for bloodwork.  Stay safe and let me know if there are questions.     If you have lab work done today you will be contacted with your lab results within the next 2 weeks.  If you have not heard from [place] then please contact us. The fastest way to get your results is to register for My Chart.   IF you received an x-ray today, you will receive an invoice from [facility]. Please contact [facility] at [phone] with questions or concerns regarding your invoice.   IF you received labwork today, you will receive an invoice from [place]. Please contact [facility] at [phone] with questions or concerns regarding your invoice.   Our billing staff will not be able to assist you with questions regarding bills from these companies.  You will be contacted with the lab results as soon as they are available. The fastest way to get your results is to activate your My Chart account. Instructions are located on the  last page of this paperwork. If you have not heard from [place] regarding the results in 2 weeks, please contact this office.          I discussed the assessment and treatment plan with the patient. The patient was provided an opportunity to ask questions and all were answered. The patient agreed with the plan and demonstrated an understanding of the instructions.   The patient was advised to call back or seek an in-person evaluation if the symptoms worsen or if the condition fails to improve as anticipated.  I provided 15 minutes of non-face-to-face time during this encounter.  Signed,   [name], MD [facility] [place].  04/23/19

## 2019-04-23 NOTE — Progress Notes (Signed)
Called patient to triage for appointment. Patient used the Interpreter [phone]. Patient states he needs a refill on the omeprazole only and no other complaints. I pend the medication.  Also, patient states he went to the [facility] on [address] today for his Shingles vaccine.

## 2019-04-23 NOTE — Patient Instructions (Addendum)
I refilled the omeprazole for now, but please call and schedule follow up appointment with gastroenterologist, as blood counts were still low at last visit.   Continue same dose of blood pressure med and asthma inhaler.   Please be seen in next 3 months for bloodwork.  Stay safe and let me know if there are questions.     If you have lab work done today you will be contacted with your lab results within the next 2 weeks.  If you have not heard from [place] then please contact us. The fastest way to get your results is to register for My Chart.   IF you received an x-ray today, you will receive an invoice from [facility]. Please contact [facility] at [phone] with questions or concerns regarding your invoice.   IF you received labwork today, you will receive an invoice from [place]. Please contact [facility] at [phone] with questions or concerns regarding your invoice.   Our billing staff will not be able to assist you with questions regarding bills from these companies.  You will be contacted with the lab results as soon as they are available. The fastest way to get your results is to activate your My Chart account. Instructions are located on the last page of this paperwork. If you have not heard from [place] regarding the results in 2 weeks, please contact this office.

## 2019-05-03 NOTE — Progress Notes (Signed)
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  His ejection fraction was 35% at one time.  Most recently it was 50% with mild AI.  He returns for follow up.  He is done very well since I last saw him.  This visit was done through an interpreter.  He has not been working because of COVID-19. The patient denies any new symptoms such as chest discomfort, neck or arm discomfort. There has been no new shortness of breath, PND or orthopnea. There have been no reported palpitations, presyncope or syncope.   No Known Allergies  Current Outpatient Medications  Medication Sig Dispense Refill  . ALPRAZolam (XANAX) 0.5 MG tablet Take 0.5-1 tablets (0.25-0.5 mg total) by mouth daily as needed (Acute panic only.). 4 tablet 0  . aspirin 81 MG tablet Take 81 mg by mouth daily.      Marland Kitchen diltiazem (CARDIZEM CD) 180 MG 24 hr capsule Take 1 capsule (180 mg total) by mouth daily. 90 capsule 1  . fluticasone (FLOVENT HFA) 44 MCG/ACT inhaler Inhale 2 puffs into the lungs 2 (two) times daily. (up to 2 times daily) 31.8 Inhaler 5  . omeprazole (PRILOSEC) 20 MG capsule TAKE 1 CAPSULE BY MOUTH EVERY DAY 90 capsule 1  . polyethylene glycol powder (GLYCOLAX/MIRALAX) powder Take 17 g by mouth 2 (two) times daily as needed. 578 g 1   Current Facility-Administered Medications  Medication Dose Route Frequency Provider Last Rate Last Dose  . 0.9 %  sodium chloride infusion  500 mL Intravenous Continuous Armbruster, Carlota Raspberry, MD        Past Medical History:  Diagnosis Date  . Anxiety   . Chest pain    per pt/ was resolved/ was [place] per pt.  . Deafness    Pt does sign language!  Marland Kitchen Dysphagia, unspecified(787.20)   . Esophageal spasm   . Esophageal stricture   . GERD (gastroesophageal reflux disease)    no meds  . Hiatal hernia   . Hypertension   . Murmur, diastolic 0086   U/S was ok per pt.  . S/P colonoscopy     Past Surgical History:  Procedure Laterality Date  .  COLONOSCOPY    . TONSILLECTOMY     as a child  . UPPER GASTROINTESTINAL ENDOSCOPY      ROS:     As stated in the HPI and negative for all other systems.  PHYSICAL EXAM BP (!) 146/70   Pulse (!) 58   Temp 98.6 F (37 C) (Temporal)   Ht 6\' 2"  (1.88 m)   Wt 164 lb 8 oz (74.6 kg)   SpO2 99%   BMI 21.12 kg/m   GENERAL:  Well appearing NECK:  No jugular venous distention, waveform within normal limits, carotid upstroke brisk and symmetric, no bruits, no thyromegaly LUNGS:  Clear to auscultation bilaterally CHEST:  Unremarkable HEART:  PMI not displaced or sustained,S1 and S2 within normal limits, no S3, no S4, no clicks, no rubs, 2/6 diastolic murmur ending in mid/late diastole without systolic murmurs ABD:  Flat, positive bowel sounds normal in frequency in pitch, no bruits, no rebound, no guarding, no midline pulsatile mass, no hepatomegaly, no splenomegaly EXT:  2 plus pulses throughout, no edema, no cyanosis no clubbing   EKG:  Sinus rhythm with sinus arrhythmia, rate 58, axis within normal limits, intervals within normal limits,no acute ST-T wave changes.  PACs  05/04/2019  ASSESSMENT AND PLAN  CARDIOMYOPATHY -  His ejection fraction was low normal.  No change in therapy.   HYPERTENSION, UNSPECIFIED -  Blood pressure is very mildly elevated.  We will see if we can get him a blood pressure, and we can monitor this at home.   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  No further imaging.  I will follow this clinically and see him again in 1 year.

## 2019-05-04 ENCOUNTER — Encounter: Payer: Self-pay | Admitting: Cardiology

## 2019-05-04 ENCOUNTER — Other Ambulatory Visit: Payer: Self-pay

## 2019-05-04 ENCOUNTER — Ambulatory Visit (INDEPENDENT_AMBULATORY_CARE_PROVIDER_SITE_OTHER): Payer: Medicare Other | Admitting: Cardiology

## 2019-05-04 VITALS — BP 146/70 | HR 58 | Temp 98.6°F | Ht 74.0 in | Wt 164.5 lb

## 2019-05-04 DIAGNOSIS — I10 Essential (primary) hypertension: Secondary | ICD-10-CM | POA: Diagnosis not present

## 2019-05-04 DIAGNOSIS — I351 Nonrheumatic aortic (valve) insufficiency: Secondary | ICD-10-CM | POA: Diagnosis not present

## 2019-05-04 NOTE — Patient Instructions (Signed)
Medication Instructions:  Your physician recommends that you continue on your current medications as directed. Please refer to the Current Medication list given to you today.  Follow-Up: At [facility], you and your health needs are our priority.  As part of our continuing mission to provide you with exceptional heart care, we have created designated Provider Care Teams.  These Care Teams include your primary Cardiologist (physician) and Advanced Practice Providers (APPs -  Physician Assistants and Nurse Practitioners) who all work together to provide you with the care you need, when you need it. You will need a follow up appointment in 12 months.  Please call our office 2 months in advance to schedule this appointment.  You may see Minus Breeding, MD or one of the following Advanced Practice Providers on your designated Care Team:   Rosaria Ferries, PA-C . Jory Sims, DNP, ANP  Any Other Special Instructions Will Be Listed Below (If Applicable).  We will put in a referral to our [place] about trying to get a home blood pressure cuff for you.

## 2019-05-05 ENCOUNTER — Telehealth: Payer: Self-pay | Admitting: Licensed Clinical Social Worker

## 2019-05-05 NOTE — Telephone Encounter (Signed)
CSW referred to assist patient with obtaining a BP cuff. CSW contacted patient to inform cuff will be delivered to home. Patient grateful for support and assistance. CSW available as needed. Jackie Fallyn Munnerlyn, LCSW, CCSW-MCS [phone]  

## 2019-05-11 ENCOUNTER — Other Ambulatory Visit: Payer: Self-pay

## 2019-05-11 DIAGNOSIS — Z20822 Contact with and (suspected) exposure to covid-19: Secondary | ICD-10-CM

## 2019-05-12 LAB — NOVEL CORONAVIRUS, NAA: SARS-CoV-2, NAA: NOT DETECTED

## 2019-05-13 ENCOUNTER — Telehealth: Payer: Self-pay | Admitting: Family Medicine

## 2019-05-13 NOTE — Telephone Encounter (Signed)
Pt aware covid lab test negative, not detected °

## 2019-08-02 ENCOUNTER — Other Ambulatory Visit: Payer: Self-pay | Admitting: Family Medicine

## 2019-08-02 DIAGNOSIS — J4520 Mild intermittent asthma, uncomplicated: Secondary | ICD-10-CM

## 2019-09-17 ENCOUNTER — Emergency Department (HOSPITAL_COMMUNITY): Payer: Medicare Other | Admitting: Certified Registered"

## 2019-09-17 ENCOUNTER — Encounter (HOSPITAL_COMMUNITY)
Admission: EM | Disposition: A | Discharge status: Home / Self Care | Payer: Self-pay | Source: Home / Self Care | Attending: Emergency Medicine

## 2019-09-17 ENCOUNTER — Emergency Department (HOSPITAL_COMMUNITY)
Admission: EM | Admit: 2019-09-17 | Discharge: 2019-09-20 | Disposition: A | Discharge status: Home / Self Care | Payer: Medicare Other | Attending: Emergency Medicine | Admitting: Emergency Medicine

## 2019-09-17 ENCOUNTER — Emergency Department (HOSPITAL_COMMUNITY): Payer: Medicare Other

## 2019-09-17 ENCOUNTER — Other Ambulatory Visit: Payer: Self-pay

## 2019-09-17 ENCOUNTER — Encounter (HOSPITAL_COMMUNITY): Payer: Self-pay | Admitting: Pharmacy Technician

## 2019-09-17 DIAGNOSIS — K219 Gastro-esophageal reflux disease without esophagitis: Secondary | ICD-10-CM | POA: Diagnosis not present

## 2019-09-17 DIAGNOSIS — I429 Cardiomyopathy, unspecified: Secondary | ICD-10-CM | POA: Insufficient documentation

## 2019-09-17 DIAGNOSIS — Z7951 Long term (current) use of inhaled steroids: Secondary | ICD-10-CM | POA: Diagnosis not present

## 2019-09-17 DIAGNOSIS — Z20828 Contact with and (suspected) exposure to other viral communicable diseases: Secondary | ICD-10-CM | POA: Insufficient documentation

## 2019-09-17 DIAGNOSIS — K228 Other specified diseases of esophagus: Secondary | ICD-10-CM | POA: Insufficient documentation

## 2019-09-17 DIAGNOSIS — Z79899 Other long term (current) drug therapy: Secondary | ICD-10-CM | POA: Diagnosis not present

## 2019-09-17 DIAGNOSIS — Q399 Congenital malformation of esophagus, unspecified: Secondary | ICD-10-CM | POA: Diagnosis not present

## 2019-09-17 DIAGNOSIS — H9190 Unspecified hearing loss, unspecified ear: Secondary | ICD-10-CM | POA: Insufficient documentation

## 2019-09-17 DIAGNOSIS — K222 Esophageal obstruction: Secondary | ICD-10-CM | POA: Diagnosis not present

## 2019-09-17 DIAGNOSIS — F419 Anxiety disorder, unspecified: Secondary | ICD-10-CM | POA: Diagnosis not present

## 2019-09-17 DIAGNOSIS — Z7982 Long term (current) use of aspirin: Secondary | ICD-10-CM | POA: Insufficient documentation

## 2019-09-17 DIAGNOSIS — T18128A Food in esophagus causing other injury, initial encounter: Secondary | ICD-10-CM

## 2019-09-17 DIAGNOSIS — K2950 Unspecified chronic gastritis without bleeding: Secondary | ICD-10-CM | POA: Insufficient documentation

## 2019-09-17 DIAGNOSIS — R0789 Other chest pain: Secondary | ICD-10-CM | POA: Diagnosis present

## 2019-09-17 DIAGNOSIS — Z87891 Personal history of nicotine dependence: Secondary | ICD-10-CM | POA: Insufficient documentation

## 2019-09-17 DIAGNOSIS — I10 Essential (primary) hypertension: Secondary | ICD-10-CM | POA: Insufficient documentation

## 2019-09-17 HISTORY — PX: BIOPSY: SHX5522

## 2019-09-17 HISTORY — PX: ESOPHAGOGASTRODUODENOSCOPY (EGD) WITH PROPOFOL: SHX5813

## 2019-09-17 HISTORY — PX: ESOPHAGEAL DILATION: SHX303

## 2019-09-17 LAB — TROPONIN I (HIGH SENSITIVITY): Troponin I (High Sensitivity): 6 ng/L (ref <18)

## 2019-09-17 LAB — POCT I-STAT EG7
Acid-Base Excess: 5 mmol/L — ABNORMAL HIGH (ref 0.0–2.0)
Bicarbonate: 29.4 mmol/L — ABNORMAL HIGH (ref 20.0–28.0)
Calcium, Ion: 1.1 mmol/L — ABNORMAL LOW (ref 1.15–1.40)
HCT: 34 % — ABNORMAL LOW (ref 39.0–52.0)
Hemoglobin: 11.6 g/dL — ABNORMAL LOW (ref 13.0–17.0)
O2 Saturation: 95 %
Potassium: 4 mmol/L (ref 3.5–5.1)
Sodium: 139 mmol/L (ref 135–145)
TCO2: 31 mmol/L (ref 22–32)
pCO2, Ven: 43.9 mmHg — ABNORMAL LOW (ref 44.0–60.0)
pH, Ven: 7.434 — ABNORMAL HIGH (ref 7.250–7.430)
pO2, Ven: 72 mmHg — ABNORMAL HIGH (ref 32.0–45.0)

## 2019-09-17 LAB — RESPIRATORY PANEL BY RT PCR (FLU A&B, COVID)
Influenza A by PCR: NEGATIVE
Influenza B by PCR: NEGATIVE
SARS Coronavirus 2 by RT PCR: NEGATIVE

## 2019-09-17 LAB — CBC
HCT: 33.3 % — ABNORMAL LOW (ref 39.0–52.0)
Hemoglobin: 10.8 g/dL — ABNORMAL LOW (ref 13.0–17.0)
MCH: 31.1 pg (ref 26.0–34.0)
MCHC: 32.4 g/dL (ref 30.0–36.0)
MCV: 96 fL (ref 80.0–100.0)
Platelets: 202 10*3/uL (ref 150–400)
RBC: 3.47 MIL/uL — ABNORMAL LOW (ref 4.22–5.81)
RDW: 12.5 % (ref 11.5–15.5)
WBC: 6.4 10*3/uL (ref 4.0–10.5)
nRBC: 0 % (ref 0.0–0.2)

## 2019-09-17 LAB — BASIC METABOLIC PANEL
Anion gap: 9 (ref 5–15)
BUN: 21 mg/dL (ref 8–23)
CO2: 27 mmol/L (ref 22–32)
Calcium: 9.3 mg/dL (ref 8.9–10.3)
Chloride: 104 mmol/L (ref 98–111)
Creatinine, Ser: 1.74 mg/dL — ABNORMAL HIGH (ref 0.61–1.24)
GFR calc Af Amer: 42 mL/min — ABNORMAL LOW (ref >60)
GFR calc non Af Amer: 36 mL/min — ABNORMAL LOW (ref >60)
Glucose, Bld: 105 mg/dL — ABNORMAL HIGH (ref 70–99)
Potassium: 4 mmol/L (ref 3.5–5.1)
Sodium: 140 mmol/L (ref 135–145)

## 2019-09-17 SURGERY — EGD (ESOPHAGOGASTRODUODENOSCOPY)
Anesthesia: Monitor Anesthesia Care

## 2019-09-17 SURGERY — ESOPHAGOGASTRODUODENOSCOPY (EGD) WITH PROPOFOL
Anesthesia: Monitor Anesthesia Care

## 2019-09-17 SURGERY — ESOPHAGOGASTRODUODENOSCOPY (EGD) WITH PROPOFOL
Anesthesia: General

## 2019-09-17 MED ORDER — SODIUM CHLORIDE 0.9 % IV SOLN: INTRAVENOUS | Status: DC | PRN

## 2019-09-17 MED ORDER — SODIUM CHLORIDE 0.9 % IV SOLN: INTRAVENOUS | Status: DC

## 2019-09-17 MED ORDER — GLUCAGON HCL RDNA (DIAGNOSTIC) 1 MG IJ SOLR
1.0000 mg | Freq: Once | INTRAMUSCULAR | Status: AC
  Administered 2019-09-17: 1 mg via INTRAVENOUS
  Filled 2019-09-17: qty 1

## 2019-09-17 MED ORDER — SUCCINYLCHOLINE CHLORIDE 200 MG/10ML IV SOSY
PREFILLED_SYRINGE | INTRAVENOUS | Status: DC | PRN
  Administered 2019-09-17: 140 mg via INTRAVENOUS

## 2019-09-17 MED ORDER — LIDOCAINE 2% (20 MG/ML) 5 ML SYRINGE
INTRAMUSCULAR | Status: DC | PRN
  Administered 2019-09-17: 100 mg via INTRAVENOUS

## 2019-09-17 MED ORDER — PROPOFOL 10 MG/ML IV BOLUS
INTRAVENOUS | Status: DC | PRN
  Administered 2019-09-17: 140 mg via INTRAVENOUS

## 2019-09-17 MED ORDER — SODIUM CHLORIDE 0.9% FLUSH: 3.0000 mL | Freq: Once | INTRAVENOUS | Status: DC

## 2019-09-17 MED ORDER — ONDANSETRON HCL 4 MG/2ML IJ SOLN
INTRAMUSCULAR | Status: DC | PRN
  Administered 2019-09-17: 4 mg via INTRAVENOUS

## 2019-09-17 MED ORDER — FENTANYL CITRATE (PF) 100 MCG/2ML IJ SOLN
INTRAMUSCULAR | Status: DC | PRN
  Administered 2019-09-17: 100 ug via INTRAVENOUS

## 2019-09-17 SURGICAL SUPPLY — 14 items

## 2019-09-17 NOTE — Transfer of Care (Signed)
Immediate Anesthesia Transfer of Care Note  Patient: Bradley Ramirez  Procedure(s) Performed: ESOPHAGOGASTRODUODENOSCOPY (EGD) WITH PROPOFOL (N/A )  Patient Location: Endoscopy Unit  Anesthesia Type:General  Level of Consciousness: awake and patient cooperative  Airway & Oxygen Therapy: Patient Spontanous Breathing and Patient connected to nasal cannula oxygen  Post-op Assessment: Report given to RN, Post -op Vital signs reviewed and stable and Patient moving all extremities X 4  Post vital signs: Reviewed and stable  Last Vitals:  Vitals Value Taken Time  BP 175/106 09/17/19 1729  Temp    Pulse 72 09/17/19 1729  Resp 19 09/17/19 1729  SpO2 91 % 09/17/19 1729  Vitals shown include unvalidated device data.  Last Pain:  Vitals:   09/17/19 1701  TempSrc: Oral  PainSc: 0-No pain         Complications: No apparent anesthesia complications

## 2019-09-17 NOTE — Anesthesia Postprocedure Evaluation (Signed)
Anesthesia Post Note  Patient: Bradley Ramirez  Procedure(s) Performed: ESOPHAGOGASTRODUODENOSCOPY (EGD) WITH PROPOFOL (N/A ) ESOPHAGEAL DILATION BIOPSY     Patient location during evaluation: PACU Anesthesia Type: General Level of consciousness: awake and alert Pain management: pain level controlled Vital Signs Assessment: post-procedure vital signs reviewed and stable Respiratory status: spontaneous breathing, nonlabored ventilation, respiratory function stable and patient connected to nasal cannula oxygen Cardiovascular status: blood pressure returned to baseline and stable Postop Assessment: no apparent nausea or vomiting Anesthetic complications: no    Last Vitals:  Vitals:   09/17/19 1739 09/17/19 1749  BP: (!) 158/80 (!) 156/84  Pulse: 62 (!) 57  Resp: (!) 24 (!) 23  Temp:    SpO2: 100% 94%    Last Pain:  Vitals:   09/17/19 1729  TempSrc: Temporal  PainSc: 0-No pain                 Toluwanimi Radebaugh DAVID

## 2019-09-17 NOTE — H&P (Addendum)
Chief Complaint:   Referring Provider:  No ref. provider found      ASSESSMENT AND PLAN;   #1.  Food impaction esophagus #2.  GERD with history of ? Esophageal stricture #3.  COVID-19 negative.  Plan: -Proceed with emergent EGD.  I discussed risks and benefits including small risk of perforation, bleeding, aspiration.  Risks of anesthesia were discussed. -He would continue taking omeprazole. -In the future, need to chew food specially meats and breads well and eat slowly. -May need esophageal dilatation thereafter as an outpatient. -Discussed with the daughter Jeanett Schlein who used to work in Endo 10 years ago.   HPI:    Bradley Ramirez is a 78 y.o. 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  Had some difficulty obtaining history. Longstanding history of GERD with ? esophageal stricture Ate pork chop on Tuesday and has been having problems since.  Has not been able to handle saliva.  Has been spitting up.  No cough or shortness of breath.  Also had chest pains.  Previous EGD 03/24/2009 by Dr. Grandville Silos definite stricture, dilated to 17 mm savory.  Had esophageal manometry/pH study thereafter. Previous colonoscopy 11/2016-diminutive colonic polyp SP polypectomy, diverticulosis.   Past Medical History:  Diagnosis Date  . Anxiety   . Chest pain    per pt/ was resolved/ was [place] per pt.  . Deafness    Pt does sign language!  Marland Kitchen Dysphagia, unspecified(787.20)   . Esophageal spasm   . Esophageal stricture   . GERD (gastroesophageal reflux disease)    no meds  . Hiatal hernia   . Hypertension   . Murmur, diastolic 0000000   U/S was ok per pt.  . S/P colonoscopy     Past Surgical History:  Procedure Laterality Date  . COLONOSCOPY    . TONSILLECTOMY     as a child  . UPPER GASTROINTESTINAL ENDOSCOPY      Family History  Problem Relation Age of Onset  . Uterine cancer Mother   . Cancer Mother   . Diabetes Maternal Grandfather   . Diabetes  Maternal Grandmother   . Diabetes Maternal Uncle   . Uterine cancer Maternal Aunt   . Colon cancer Neg Hx     Social History   Tobacco Use  . Smoking status: Former Smoker    Types: Cigarettes    Quit date: 1987    Years since quitting: 34.0  . Smokeless tobacco: Never Used  Substance Use Topics  . Alcohol use: No    Comment: quit 1987  . Drug use: No    Current Facility-Administered Medications  Medication Dose Route Frequency Provider Last Rate Last Admin  . 0.9 %  sodium chloride infusion  500 mL Intravenous Continuous Armbruster, Carlota Raspberry, MD      . sodium chloride flush (NS) 0.9 % injection 3 mL  3 mL Intravenous Once Julianne Rice, MD       Current Outpatient Medications  Medication Sig Dispense Refill  . ALPRAZolam (XANAX) 0.5 MG tablet Take 0.5-1 tablets (0.25-0.5 mg total) by mouth daily as needed (Acute panic only.). 4 tablet 0  . aspirin 81 MG tablet Take 81 mg by mouth daily.      Marland Kitchen diltiazem (CARDIZEM CD) 180 MG 24 hr capsule Take 1 capsule (180 mg total) by mouth daily. 90 capsule 1  . FLOVENT HFA 44 MCG/ACT inhaler TAKE 2 PUFFS BY MOUTH TWICE A DAY 31.8 Inhaler 1  . omeprazole (PRILOSEC) 20  MG capsule TAKE 1 CAPSULE BY MOUTH EVERY DAY 90 capsule 1  . polyethylene glycol powder (GLYCOLAX/MIRALAX) powder Take 17 g by mouth 2 (two) times daily as needed. 578 g 1    No Known Allergies  Review of Systems:  Constitutional: Denies fever, chills, diaphoresis, appetite change and fatigue.  HEENT: Denies photophobia, eye pain, redness, hearing loss, ear pain, congestion, sore throat, rhinorrhea, sneezing, mouth sores, neck pain, neck stiffness and tinnitus.   Respiratory: Denies SOB, DOE, cough, chest tightness,  and wheezing.   Cardiovascular: Denies chest pain, palpitations and leg swelling.  Genitourinary: Denies dysuria, urgency, frequency, hematuria, flank pain and difficulty urinating.  Musculoskeletal: Denies myalgias, back pain, joint swelling, arthralgias  and gait problem.  Skin: No rash.  Neurological: Denies dizziness, seizures, syncope, weakness, light-headedness, numbness and headaches.  Hematological: Denies adenopathy. Easy bruising, personal or family bleeding history  Psychiatric/Behavioral: No anxiety or depression     Physical Exam:    BP (!) 171/79   Pulse (!) 55   Temp 98.1 F (36.7 C) (Oral)   Resp 20   SpO2 100%  Wt Readings from Last 3 Encounters:  05/04/19 74.6 kg  04/23/19 73.5 kg  12/01/18 75.5 kg   Constitutional:  Well-developed, in no acute distress. Psychiatric: Normal mood and affect. Behavior is normal. HEENT: Pupils normal.  Conjunctivae are normal. No scleral icterus. Neck supple.  Cardiovascular: Normal rate, regular rhythm. No edema Pulmonary/chest: Effort normal and breath sounds normal. No wheezing, rales or rhonchi. Abdominal: Soft, nondistended. Nontender. Bowel sounds active throughout. There are no masses palpable. No hepatomegaly. Rectal:  defered Neurological: Alert and oriented to person place and time. Skin: Skin is warm and dry. No rashes noted.  Data Reviewed: I have personally reviewed following labs and imaging studies  CBC: CBC Latest Ref Rng & Units 09/17/2019 09/17/2019 12/01/2018  WBC 4.0 - 10.5 K/uL - 6.4 5.5  Hemoglobin 13.0 - 17.0 g/dL 11.6(L) 10.8(L) 11.7(L)  Hematocrit 39.0 - 52.0 % 34.0(L) 33.3(L) 34.0(L)  Platelets 150 - 400 K/uL - 202 227    CMP: CMP Latest Ref Rng & Units 09/17/2019 09/17/2019 12/01/2018  Glucose 70 - 99 mg/dL - 105(H) 89  BUN 8 - 23 mg/dL - 21 20  Creatinine 0.61 - 1.24 mg/dL - 1.74(H) 1.23  Sodium 135 - 145 mmol/L 139 140 141  Potassium 3.5 - 5.1 mmol/L 4.0 4.0 4.1  Chloride 98 - 111 mmol/L - 104 103  CO2 22 - 32 mmol/L - 27 22  Calcium 8.9 - 10.3 mg/dL - 9.3 9.3  Total Protein 6.0 - 8.5 g/dL - - 7.1  Total Bilirubin 0.0 - 1.2 mg/dL - - 0.5  Alkaline Phos 39 - 117 IU/L - - 74  AST 0 - 40 IU/L - - 17  ALT 0 - 44 IU/L - - 11    GFR: CrCl  cannot be calculated (Unknown ideal weight.). Liver Function Tests: No results for input(s): AST, ALT, ALKPHOS, BILITOT, PROT, ALBUMIN in the last 168 hours. No results for input(s): LIPASE, AMYLASE in the last 168 hours. No results for input(s): AMMONIA in the last 168 hours. Coagulation Profile: No results for input(s): INR, PROTIME in the last 168 hours. HbA1C: No results for input(s): HGBA1C in the last 72 hours. Lipid Profile: No results for input(s): CHOL, HDL, LDLCALC, TRIG, CHOLHDL, LDLDIRECT in the last 72 hours. Thyroid Function Tests: No results for input(s): TSH, T4TOTAL, FREET4, T3FREE, THYROIDAB in the last 72 hours. Anemia Panel: No results for input(s):  VITAMINB12, FOLATE, FERRITIN, TIBC, IRON, RETICCTPCT in the last 72 hours.  Recent Results (from the past 240 hour(s))  Respiratory Panel by RT PCR (Flu A&B, Covid) - Nasopharyngeal Swab     Status: None   Collection Time: 09/17/19 12:51 PM   Specimen: Nasopharyngeal Swab  Result Value Ref Range Status   SARS Coronavirus 2 by RT PCR NEGATIVE NEGATIVE Final    Comment: (NOTE) SARS-CoV-2 target nucleic acids are NOT DETECTED. The SARS-CoV-2 RNA is generally detectable in upper respiratoy specimens during the acute phase of infection. The lowest concentration of SARS-CoV-2 viral copies this assay can detect is 131 copies/mL. A negative result does not preclude SARS-Cov-2 infection and should not be used as the sole basis for treatment or other patient management decisions. A negative result may occur with  improper specimen collection/handling, submission of specimen other than nasopharyngeal swab, presence of viral mutation(s) within the areas targeted by this assay, and inadequate number of viral copies (<131 copies/mL). A negative result must be combined with clinical observations, patient history, and epidemiological information. The expected result is Negative. Fact Sheet for Patients:    PinkCheek.be Fact Sheet for Healthcare Providers:  GravelBags.it This test is not yet ap proved or cleared by the [place] FDA and  has been authorized for detection and/or diagnosis of SARS-CoV-2 by FDA under an Emergency Use Authorization (EUA). This EUA will remain  in effect (meaning this test can be used) for the duration of the COVID-19 declaration under Section 564(b)(1) of the Act, 21 U.S.C. section 360bbb-3(b)(1), unless the authorization is terminated or revoked sooner.    Influenza A by PCR NEGATIVE NEGATIVE Final   Influenza B by PCR NEGATIVE NEGATIVE Final    Comment: (NOTE) The Xpert Xpress SARS-CoV-2/FLU/RSV assay is intended as an aid in  the diagnosis of influenza from Nasopharyngeal swab specimens and  should not be used as a sole basis for treatment. Nasal washings and  aspirates are unacceptable for Xpert Xpress SARS-CoV-2/FLU/RSV  testing. Fact Sheet for Patients: PinkCheek.be Fact Sheet for Healthcare Providers: GravelBags.it This test is not yet approved or cleared by the [place] FDA and  has been authorized for detection and/or diagnosis of SARS-CoV-2 by  FDA under an Emergency Use Authorization (EUA). This EUA will remain  in effect (meaning this test can be used) for the duration of the  Covid-19 declaration under Section 564(b)(1) of the Act, 21  U.S.C. section 360bbb-3(b)(1), unless the authorization is  terminated or revoked. Performed at [facility], [place] [address], [place], [place] [address]       Radiology Studies: DG Chest 2 View  Result Date: 09/17/2019 CLINICAL DATA:  Chest tightness with nausea. EXAM: CHEST - 2 VIEW COMPARISON:  09/26/2016 FINDINGS: Lungs are hyperexpanded. The lungs are clear without focal pneumonia, edema, pneumothorax or pleural effusion. Asymmetric biapical scarring is stable in the  interval. The cardiopericardial silhouette is within normal limits for size. Nodular density/densities projecting over the lungs are compatible with pads for telemetry leads. The visualized bony structures of the thorax are intact. IMPRESSION: 1. 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 Electronically Signed   By: Misty Stanley M.D.   On: 09/17/2019 11:54      Carmell Austria, MD 09/17/2019, 4:43 PM  Cc: No ref. provider found

## 2019-09-17 NOTE — ED Notes (Signed)
ED Provider at bedside. 

## 2019-09-17 NOTE — ED Notes (Signed)
Pt to endo w/ [place] RN

## 2019-09-17 NOTE — ED Provider Notes (Signed)
[facility] EMERGENCY DEPARTMENT Provider Note   CSN: [id number] Arrival date & time: 09/17/19  1100     History Chief Complaint  Patient presents with  . Chest Pain    Sonja Stieg is a 78 y.o. male.  HPI    Patient is deaf.  Communicated through writing.  Has a history of gastroesophageal reflux disease and esophageal strictures.  States that on Tuesday he ate pork chop and felt like it got stuck in his chest.  He has not been able to really tolerate much oral intake since that point.  States he has been spitting up and vomiting since then.   Past Medical History:  Diagnosis Date  . Anxiety   . Chest pain    per pt/ was resolved/ was [place] per pt.  . Deafness    Pt does sign language!  Marland Kitchen Dysphagia, unspecified(787.20)   . Esophageal spasm   . Esophageal stricture   . GERD (gastroesophageal reflux disease)    no meds  . Hiatal hernia   . Hypertension   . Murmur, diastolic 0000000   U/S was ok per pt.  . S/P colonoscopy     Patient Active Problem List   Diagnosis Date Noted  . Constipation 11/24/2017  . Deaf 10/28/2013  . CARDIOMYOPATHY 04/20/2009  . HYPERTENSION, UNSPECIFIED 04/19/2009  . HIATAL HERNIA, HX OF 04/19/2009    Past Surgical History:  Procedure Laterality Date  . COLONOSCOPY    . TONSILLECTOMY     as a child  . UPPER GASTROINTESTINAL ENDOSCOPY         Family History  Problem Relation Age of Onset  . Uterine cancer Mother   . Cancer Mother   . Diabetes Maternal Grandfather   . Diabetes Maternal Grandmother   . Diabetes Maternal Uncle   . Uterine cancer Maternal Aunt   . Colon cancer Neg Hx     Social History   Tobacco Use  . Smoking status: Former Smoker    Types: Cigarettes    Quit date: 1987    Years since quitting: 34.0  . Smokeless tobacco: Never Used  Substance Use Topics  . Alcohol use: No    Comment: quit 1987  . Drug use: No    Home Medications Prior to Admission medications   Medication Sig  Start Date End Date Taking? Authorizing Provider  ALPRAZolam Duanne Moron) 0.5 MG tablet Take 0.5-1 tablets (0.25-0.5 mg total) by mouth daily as needed (Acute panic only.). 01/09/18   Tereasa Coop, PA-C  aspirin 81 MG tablet Take 81 mg by mouth daily.      [provider]  diltiazem (CARDIZEM CD) 180 MG 24 hr capsule Take 1 capsule (180 mg total) by mouth daily. 04/23/19   Wendie Agreste, MD  FLOVENT HFA 44 MCG/ACT inhaler TAKE 2 PUFFS BY MOUTH TWICE A DAY 08/02/19   Wendie Agreste, MD  omeprazole (PRILOSEC) 20 MG capsule TAKE 1 CAPSULE BY MOUTH EVERY DAY 04/23/19   Wendie Agreste, MD  polyethylene glycol powder (GLYCOLAX/MIRALAX) powder Take 17 g by mouth 2 (two) times daily as needed. 10/31/17   McVey, Gelene Mink, PA-C    Allergies    Patient has no known allergies.  Review of Systems   Review of Systems  Constitutional: Negative for fever.  HENT: Positive for trouble swallowing.   Cardiovascular: Positive for chest pain.  Gastrointestinal: Positive for vomiting. Negative for abdominal pain and diarrhea.  Musculoskeletal: Negative for back pain and neck pain.  Skin: Negative for rash and wound.  Neurological: Negative for dizziness, weakness, light-headedness, numbness and headaches.  All other systems reviewed and are negative.   Physical Exam Updated Vital Signs BP (!) 166/98   Pulse (!) 59   Temp 98.1 F (36.7 C) (Oral)   Resp 20   SpO2 99%   Physical Exam Vitals and nursing note reviewed.  Constitutional:      General: He is not in acute distress.    Appearance: Normal appearance. He is well-developed. He is not ill-appearing.  HENT:     Head: Normocephalic and atraumatic.     Nose: Nose normal.     Mouth/Throat:     Mouth: Mucous membranes are moist.  Eyes:     Extraocular Movements: Extraocular movements intact.     Pupils: Pupils are equal, round, and reactive to light.  Neck:     Comments: No stridor Cardiovascular:     Rate and Rhythm:  Normal rate and regular rhythm.     Heart sounds: No murmur. No friction rub. No gallop.   Pulmonary:     Effort: Pulmonary effort is normal. No respiratory distress.     Breath sounds: Normal breath sounds. No stridor. No wheezing, rhonchi or rales.  Chest:     Chest wall: No tenderness.  Abdominal:     General: Bowel sounds are normal. There is no distension.     Palpations: Abdomen is soft.     Tenderness: There is no abdominal tenderness. There is no right CVA tenderness, left CVA tenderness, guarding or rebound.  Musculoskeletal:        General: No swelling, tenderness, deformity or signs of injury. Normal range of motion.     Cervical back: Normal range of motion and neck supple. No rigidity or tenderness.     Right lower leg: No edema.     Left lower leg: No edema.  Lymphadenopathy:     Cervical: No cervical adenopathy.  Skin:    General: Skin is warm and dry.     Capillary Refill: Capillary refill takes less than 2 seconds.     Findings: No erythema or rash.  Neurological:     General: No focal deficit present.     Mental Status: He is alert and oriented to person, place, and time.  Psychiatric:        Behavior: Behavior normal.     ED Results / Procedures / Treatments   Labs (all labs ordered are listed, but only abnormal results are displayed) Labs Reviewed  BASIC METABOLIC PANEL - Abnormal; Notable for the following components:      Result Value   Glucose, Bld 105 (*)    Creatinine, Ser 1.74 (*)    GFR calc non Af Amer 36 (*)    GFR calc Af Amer 42 (*)    All other components within normal limits  CBC - Abnormal; Notable for the following components:   RBC 3.47 (*)    Hemoglobin 10.8 (*)    HCT 33.3 (*)    All other components within normal limits  POCT I-STAT EG7 - Abnormal; Notable for the following components:   pH, Ven 7.434 (*)    pCO2, Ven 43.9 (*)    pO2, Ven 72.0 (*)    Bicarbonate 29.4 (*)    Acid-Base Excess 5.0 (*)    Calcium, Ion 1.10 (*)     HCT 34.0 (*)    Hemoglobin 11.6 (*)    All other components within normal limits  RESPIRATORY PANEL BY RT PCR (FLU A&B, COVID)  TROPONIN I (HIGH SENSITIVITY)    EKG EKG Interpretation  Date/Time:  Friday September 17 2019 11:15:48 EST Ventricular Rate:  71 PR Interval:  170 QRS Duration: 76 QT Interval:  392 QTC Calculation: 425 R Axis:   -6 Text Interpretation: Normal sinus rhythm Normal ECG Confirmed by Julianne Rice [phone]) on 09/17/2019 11:43:57 AM   Radiology DG Chest 2 View  Result Date: 09/17/2019 CLINICAL DATA:  Chest tightness with nausea. EXAM: CHEST - 2 VIEW COMPARISON:  09/26/2016 FINDINGS: Lungs are hyperexpanded. The lungs are clear without focal pneumonia, edema, pneumothorax or pleural effusion. Asymmetric biapical scarring is stable in the interval. The cardiopericardial silhouette is within normal limits for size. Nodular density/densities projecting over the lungs are compatible with pads for telemetry leads. The visualized bony structures of the thorax are intact. IMPRESSION: 1. 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 Electronically Signed   By: Misty Stanley M.D.   On: 09/17/2019 11:54    Procedures Procedures (including critical care time)  Medications Ordered in ED Medications  sodium chloride flush (NS) 0.9 % injection 3 mL (has no administration in time range)  glucagon (human recombinant) (GLUCAGEN) injection 1 mg (1 mg Intravenous Given 09/17/19 1246)    ED Course  I have reviewed the triage vital signs and the nursing notes.  Pertinent labs & imaging results that were available during my care of the patient were reviewed by me and considered in my medical decision making (see chart for details).    MDM Rules/Calculators/A&P                      We will attempt to give glucagon to see if this improves his symptoms if not will consult with our gastroenterology for likely endoscopy. Patient states improvement of his symptoms after  glucagon.  Will give p.o. trial.  Patient able to tolerate small liquid sips but no solids.  Signout to oncoming emergency provider pending GI consult Final Clinical Impression(s) / ED Diagnoses Final diagnoses:  Esophageal obstruction due to food impaction    Rx / DC Orders ED Discharge Orders    None       Julianne Rice, MD 09/17/19 1503

## 2019-09-17 NOTE — Op Note (Signed)
[facility] Patient Name: Bradley Ramirez Procedure Date : 09/17/2019 MRN: [id number] Attending MD: Jackquline Denmark , MD Date of Birth: June 08, 1941 CSN: [id number] Age: 78 Admit Type: Emergency Department Procedure:                Upper GI endoscopy Indications:              Suspected foreign body in the esophagus. GERD with                            previous history of dysphagia. Providers:                Jackquline Denmark, MD, Glori Bickers, RN, Laverda Sorenson,                            Technician, Vania Rea, CRNA Referring MD:              Medicines:                General Anesthesia Complications:            No immediate complications. Estimated Blood Loss:     Estimated blood loss: none. Procedure:                Pre-Anesthesia Assessment:                           - Prior to the procedure, a History and Physical                            was performed, and patient medications and                            allergies were reviewed. The patient's tolerance of                            previous anesthesia was also reviewed. The risks                            and benefits of the procedure and the sedation                            options and risks were discussed with the patient.                            All questions were answered, and informed consent                            was obtained. Prior Anticoagulants: The patient has                            taken no previous anticoagulant or antiplatelet                            agents. ASA Grade Assessment: E - Emergency. After  reviewing the risks and benefits, the patient was                            deemed in satisfactory condition to undergo the                            procedure.                           After obtaining informed consent, the endoscope was                            passed under direct vision. Throughout the                            procedure, the patient's  blood pressure, pulse, and                            oxygen saturations were monitored continuously. The                            [id number]) Olympus gastroscope was                            introduced through the mouth, and advanced to the                            second part of duodenum. The upper GI endoscopy was                            accomplished without difficulty. The patient                            tolerated the procedure well. Scope In: Scope Out: Findings:      No food impaction was noted. The examined esophagus was mildly tortuous       but normal with well-defined Z-line at 40 cm. Examined by NBI. No       esophagitis. Hence, we decided to proceed with biopsies and esophageal       dilatation. Biopsies were obtained from the proximal and distal       esophagus with cold forceps for histology to r/o eosinophilic       esophagitis. The scope was withdrawn. Dilation was performed with a       Maloney dilator with mild resistance at 50 Fr.      Localized mild inflammation characterized by erythema was found in the       gastric antrum. Biopsies were taken with a cold forceps for histology.      The examined duodenum was normal. Impression:               -Presbyesophagus s/p esophageal dilatation.                           -Mild Gastritis. Biopsied. Recommendation:           - Soft diet x 24hrs. Then resume previous diet.                           -  Chew to meats and breads well and eat slowly                            always.                           - Continue omeprazole 20 mg p.o. once a day.                           - Await pathology results.                           - Avoid ibuprofen, naproxen, or other non-steroidal                            anti-inflammatory drugs.                           - Return to GI clinic in 8 weeks (Dr Havery Moros).                           - The findings and recommendations were discussed                            with  the patient's daughter who used to work in                            Endo here at [employer] several years ago.. Procedure Code(s):        --- Professional ---                           [phone], Esophagogastroduodenoscopy, flexible,                            transoral; with biopsy, single or multiple                           43450, Dilation of esophagus, by unguided sound or                            bougie, single or multiple passes Diagnosis Code(s):        --- Professional ---                           Q39.9, Congenital malformation of esophagus,                            unspecified                           K29.70, Gastritis, unspecified, without bleeding                           T18.108A, Unspecified foreign body in esophagus  causing other injury, initial encounter CPT copyright 2019 [employer]. All rights reserved. The codes documented in this report are preliminary and upon coder review may  be revised to meet current compliance requirements. Jackquline Denmark, MD 09/17/2019 5:30:40 PM This report has been signed electronically. Number of Addenda: 0

## 2019-09-17 NOTE — Anesthesia Preprocedure Evaluation (Signed)
Anesthesia Evaluation  Patient identified by MRN, date of birth, ID band Patient awake    Reviewed: Allergy & Precautions, NPO status , Patient's Chart, lab work & pertinent test results  Airway Mallampati: I  TM Distance: >3 FB Neck ROM: Full    Dental   Pulmonary former smoker,    Pulmonary exam normal        Cardiovascular hypertension, Pt. on medications Normal cardiovascular exam     Neuro/Psych Anxiety    GI/Hepatic   Endo/Other    Renal/GU      Musculoskeletal   Abdominal   Peds  Hematology   Anesthesia Other Findings   Reproductive/Obstetrics                             Anesthesia Physical Anesthesia Plan  ASA: II  Anesthesia Plan: General   Post-op Pain Management:    Induction: Intravenous, Rapid sequence and Cricoid pressure planned  PONV Risk Score and Plan: 2 and Ondansetron and Treatment may vary due to age or medical condition  Airway Management Planned: Oral ETT  Additional Equipment:   Intra-op Plan:   Post-operative Plan: Extubation in OR  Informed Consent: I have reviewed the patients History and Physical, chart, labs and discussed the procedure including the risks, benefits and alternatives for the proposed anesthesia with the patient or authorized representative who has indicated his/her understanding and acceptance.       Plan Discussed with: CRNA and Surgeon  Anesthesia Plan Comments:         Anesthesia Quick Evaluation

## 2019-09-17 NOTE — Anesthesia Procedure Notes (Signed)
Procedure Name: Intubation Date/Time: 09/17/2019 5:11 PM Performed by: Orlie Dakin, CRNA Pre-anesthesia Checklist: Patient identified, Emergency Drugs available, Suction available and Patient being monitored Patient Re-evaluated:Patient Re-evaluated prior to induction Oxygen Delivery Method: Circle system utilized Preoxygenation: Pre-oxygenation with 100% oxygen Induction Type: IV induction, Rapid sequence and Cricoid Pressure applied Laryngoscope Size: Miller and 3 Grade View: Grade I Tube type: Oral Tube size: 7.5 mm Number of attempts: 1 Airway Equipment and Method: Stylet Placement Confirmation: ETT inserted through vocal cords under direct vision,  positive ETCO2 and breath sounds checked- equal and bilateral Secured at: 24 cm Tube secured with: Tape Dental Injury: Teeth and Oropharynx as per pre-operative assessment

## 2019-09-17 NOTE — ED Provider Notes (Addendum)
Pt signed out by Dr. Lita Mains pending GI call.  I spoke with Dr. Lyndel Safe who is on call for [address].  He will call the Endoscopy team in for likely food impaction removal.  Pt is covid negative.   Isla Pence, MD 09/17/19 1528  Dr. Lyndel Safe did an EGD which revealed an esophageal stricture with food bolus.  He dilated pt and pushed his pork chop into his stomach.  Pt is now awake and alert.  He is able to swallow water.  He is stable for d/c.  Due to language barrier, an ASL video interpreter was present during the history-taking and subsequent discussion (and for part of the physical exam) with this patient.   Isla Pence, MD 09/17/19 [phone]

## 2019-09-17 NOTE — ED Triage Notes (Signed)
Pt arrives pov with reports of chest tightness onset Tuesday. Endorses nausea.

## 2019-09-18 ENCOUNTER — Other Ambulatory Visit: Payer: Self-pay | Admitting: Physician Assistant

## 2019-09-20 ENCOUNTER — Encounter: Payer: Self-pay | Admitting: Gastroenterology

## 2019-09-20 LAB — SURGICAL PATHOLOGY

## 2019-09-22 ENCOUNTER — Encounter: Payer: Self-pay | Admitting: Family Medicine

## 2019-09-22 ENCOUNTER — Other Ambulatory Visit: Payer: Self-pay

## 2019-09-22 ENCOUNTER — Ambulatory Visit (INDEPENDENT_AMBULATORY_CARE_PROVIDER_SITE_OTHER): Payer: Medicare Other

## 2019-09-22 ENCOUNTER — Ambulatory Visit: Payer: Medicare Other | Admitting: Family Medicine

## 2019-09-22 VITALS — BP 158/86 | HR 84 | Temp 98.3°F | Resp 16 | Ht 74.0 in | Wt 153.0 lb

## 2019-09-22 DIAGNOSIS — R109 Unspecified abdominal pain: Secondary | ICD-10-CM | POA: Diagnosis not present

## 2019-09-22 DIAGNOSIS — S39011A Strain of muscle, fascia and tendon of abdomen, initial encounter: Secondary | ICD-10-CM | POA: Diagnosis not present

## 2019-09-22 LAB — POC MICROSCOPIC URINALYSIS (UMFC): Mucus: ABSENT

## 2019-09-22 LAB — POCT URINALYSIS DIP (MANUAL ENTRY)
Bilirubin, UA: NEGATIVE
Blood, UA: NEGATIVE
Glucose, UA: NEGATIVE mg/dL
Ketones, POC UA: NEGATIVE mg/dL
Leukocytes, UA: NEGATIVE
Nitrite, UA: NEGATIVE
Protein Ur, POC: NEGATIVE mg/dL
Spec Grav, UA: 1.02 (ref 1.010–1.025)
Urobilinogen, UA: 0.2 E.U./dL
pH, UA: 7.5 (ref 5.0–8.0)

## 2019-09-22 MED ORDER — TRAMADOL HCL 50 MG PO TABS: ORAL_TABLET | ORAL | 0 refills | Status: DC

## 2019-09-22 NOTE — Patient Instructions (Addendum)
Take Tylenol (acetaminophen) 500 mg 2 pills 3 times daily for pain.  I think if you use them on a regular basis they will help considerably.  For more severe pain take an occasional Aleve or Advil, however because of your esophagus problems this might cause more pain and irritation so I would discourage regular use of Aleve and Advil (naproxen and ibuprofen).  I am prescribing some tramadol 50 mg to take 1 every 8 hours if needed for worse pain.  Due to your age, be cautious with the use of pain medication to be certain they do not cause confusion.  This will probably come down over the next week or 2, but will just take time.  If you are getting worse or not improving get rechecked.  No fracture is seen on the x-ray.

## 2019-09-22 NOTE — Progress Notes (Signed)
Patient ID: Bradley Ramirez, male    DOB: 1941/06/04  Age: 78 y.o. MRN: [id number]  Chief Complaint  Patient presents with  . left side pain    going on for 1 week. 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  Afterwards he developed left flank pain which has persisted.  Tylenol does not seem to give relief.  On Christmas day he had problems with esophageal dysphagia and went to the emergency room, had to have an esophageal dilatation done.  However that was unrelated to this.  The pain is worse when he reaches his left arm up.  Bending and moving or rolling around on that side hurts.  He has not had this problem in the past.  He is generally fairly active though is family will not let him do things like climbing up on a ladder.  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  He says was able to understand everything fully communicated to her and had no questions.  Current allergies, medications, problem list, past/family and social histories reviewed.  Objective:  BP (!) 158/86   Pulse 84   Temp 98.3 F (36.8 C) (Temporal)   Resp 16   Ht 6\' 2"  (1.88 m)   Wt 153 lb (69.4 kg)   SpO2 96%   BMI 19.64 kg/m   No major acute distress.  Pleasant alert man.  Chest clear.  No chest wall tenderness until you get to the bottom 2 ribs posteriorly where it is a little tender.  This seems to be more tenderness just underneath the ribs.  Abdomen soft without mass or tenderness straight leg raising test on the left is negative on the right gives contralateral left flank pain.   Assessment & Plan:   Assessment: 1. Flank pain   2. Flank strain, initial encounter       Plan: I think this is a strain of the muscles of the flank.  See instructions.  Orders Placed This Encounter  Procedures  . DG Ribs Unilateral Left    Standing Status:   Future    Standing Expiration Date:   11/20/2020    Order  Specific Question:   Reason for Exam (SYMPTOM  OR DIAGNOSIS REQUIRED)    Answer:   pain over lower ribs    Order Specific Question:   Preferred imaging location?    Answer:   External    Order Specific Question:   Radiology Contrast Protocol - do NOT remove file path    Answer:   [email or web address]  . POCT urinalysis dipstick  . POCT Microscopic Urinalysis ([facility])    No orders of the defined types were placed in this encounter.        Patient Instructions  Take Tylenol (acetaminophen) 500 mg 2 pills 3 times daily for pain.  I think if you use them on a regular basis they will help considerably.  For more severe pain take an occasional Aleve or Advil, however because of your esophagus problems this might cause more pain and irritation so I would discourage regular use of Aleve and Advil (naproxen and ibuprofen).  I am prescribing some tramadol 50 mg to take 1 every 8 hours if needed for worse pain.  Due to your age, be cautious with the use of pain medication to be certain they do not cause confusion.  This will probably come down over  the next week or 2, but will just take time.  If you are getting worse or not improving get rechecked.    Return if symptoms worsen or fail to improve.   Ruben Reason, MD 09/22/2019

## 2019-09-24 ENCOUNTER — Ambulatory Visit (HOSPITAL_COMMUNITY)
Admission: EM | Admit: 2019-09-24 | Discharge: 2019-09-24 | Disposition: A | Discharge status: Home / Self Care | Payer: Medicare Other | Attending: Family Medicine | Admitting: Family Medicine

## 2019-09-24 ENCOUNTER — Encounter (HOSPITAL_COMMUNITY): Payer: Self-pay

## 2019-09-24 DIAGNOSIS — R1310 Dysphagia, unspecified: Secondary | ICD-10-CM

## 2019-09-24 DIAGNOSIS — K2100 Gastro-esophageal reflux disease with esophagitis, without bleeding: Secondary | ICD-10-CM

## 2019-09-24 MED ORDER — ALUM & MAG HYDROXIDE-SIMETH 200-200-20 MG/5ML PO SUSP
30.0000 mL | Freq: Once | ORAL | Status: AC
  Administered 2019-09-24: 30 mL via ORAL

## 2019-09-24 MED ORDER — ALUM & MAG HYDROXIDE-SIMETH 200-200-20 MG/5ML PO SUSP
ORAL | Status: AC
  Filled 2019-09-24: qty 30

## 2019-09-24 NOTE — ED Triage Notes (Signed)
Pt presents to UC with difficulty to swallow food. Pt reports he was seen at the ED on 09/17/2019  For the same chief complaint. Pt states he started feeling he has food stuck in his throat this morning when he was taking his pill and having breakfast. Pt denies any difficulty breathing or pain.

## 2019-09-24 NOTE — Discharge Instructions (Addendum)
Please follow-up with your GI specialist on Monday, Dr. Havery Moros. You may need to be seen in the office sooner than your scheduled February appointment.  Continue to limit foods that increase risk for choking. Please review the attached soft food recommendation. If you experience difficulty breathing, swallowing, persistent coughing with food or fluid intake, follow-up immediately at the Emergency Department

## 2019-09-24 NOTE — ED Provider Notes (Signed)
[facility]    CSN: [id number] Arrival date & time: 09/24/19  1626     History   Chief Complaint Chief Complaint  Patient presents with  . Dysphagia    HPI Bradley Ramirez is a 79 y.o. male.   Patient is hearing impaired. ASL interpreter utilized to facilitate communication during today's visit.  HPI Patient presents with a complaint of sensation of food stuck in throat after eating a pancake and taking morning medications at the same time.  Patient was seen in the emergency department on 09/17/2019 and was found to have esophageal stricture and underwent an EGD with esophageal dilation for correction of problem.  Patient reports that he achieved significant relief after the procedure and was well with tolerating solid foods up until today. Patient is not having sensation of choking and is able to tolerate oral fluids. He feels globulus sensation in his esophagus reason.  Patient is not having any trouble breathing, drooling or difficulty swallowing. Past Medical History:  Diagnosis Date  . Anxiety   . Chest pain    per pt/ was resolved/ was [place] per pt.  . Deafness    Pt does sign language!  Marland Kitchen Dysphagia, unspecified(787.20)   . Esophageal spasm   . Esophageal stricture   . GERD (gastroesophageal reflux disease)    no meds  . Hiatal hernia   . Hypertension   . Murmur, diastolic 0000000   U/S was ok per pt.  . S/P colonoscopy     Patient Active Problem List   Diagnosis Date Noted  . Esophageal obstruction due to food impaction   . Esophageal stricture   . Constipation 11/24/2017  . Deaf 10/28/2013  . CARDIOMYOPATHY 04/20/2009  . HYPERTENSION, UNSPECIFIED 04/19/2009  . HIATAL HERNIA, HX OF 04/19/2009    Past Surgical History:  Procedure Laterality Date  . BIOPSY  09/17/2019   Procedure: BIOPSY;  Surgeon: Jackquline Denmark, MD;  Location: [facility] ENDOSCOPY;  Service: Endoscopy;;  . COLONOSCOPY    . ESOPHAGEAL DILATION  09/17/2019   Procedure: ESOPHAGEAL MALONEY  DILATION;  Surgeon: Jackquline Denmark, MD;  Location: [facility] ENDOSCOPY;  Service: Endoscopy;;  . ESOPHAGOGASTRODUODENOSCOPY (EGD) WITH PROPOFOL N/A 09/17/2019   Procedure: ESOPHAGOGASTRODUODENOSCOPY (EGD) WITH PROPOFOL;  Surgeon: Jackquline Denmark, MD;  Location: [facility] ENDOSCOPY;  Service: Endoscopy;  Laterality: N/A;  . TONSILLECTOMY     as a child  . UPPER GASTROINTESTINAL ENDOSCOPY        Home Medications    Prior to Admission medications   Medication Sig Start Date End Date Taking? Authorizing Provider  ALPRAZolam Duanne Moron) 0.5 MG tablet Take 0.5-1 tablets (0.25-0.5 mg total) by mouth daily as needed (Acute panic only.). 01/09/18   Tereasa Coop, PA-C  aspirin 81 MG tablet Take 81 mg by mouth daily.      [provider]  diltiazem (CARDIZEM CD) 180 MG 24 hr capsule Take 1 capsule (180 mg total) by mouth daily. 04/23/19   Wendie Agreste, MD  FLOVENT HFA 44 MCG/ACT inhaler TAKE 2 PUFFS BY MOUTH TWICE A DAY 08/02/19   Wendie Agreste, MD  ofloxacin (OCUFLOX) 0.3 % ophthalmic solution Instill 1 drop TID in operative eye starting 2 days prior to surgery and after surgery for 3 weeks. 08/24/19   [provider]  omeprazole (PRILOSEC) 20 MG capsule TAKE 1 CAPSULE BY MOUTH EVERY DAY 04/23/19   Wendie Agreste, MD  polyethylene glycol powder (GLYCOLAX/MIRALAX) powder Take 17 g by mouth 2 (two) times daily as needed. Patient  not taking: Reported on 09/22/2019 10/31/17   McVey, Gelene Mink, PA-C  traMADol (ULTRAM) 50 MG tablet Take 1 every 8 hours only if needed for severe pain.  Take with food. 09/22/19   Posey Boyer, MD    Family History Family History  Problem Relation Age of Onset  . Uterine cancer Mother   . Cancer Mother   . Diabetes Maternal Grandfather   . Diabetes Maternal Grandmother   . Diabetes Maternal Uncle   . Uterine cancer Maternal Aunt   . Colon cancer Neg Hx     Social History Social History   Tobacco Use  . Smoking status: Former Smoker    Types:  Cigarettes    Quit date: 1987    Years since quitting: 34.0  . Smokeless tobacco: Never Used  Substance Use Topics  . Alcohol use: No    Comment: quit 1987  . Drug use: No     Allergies   Patient has no known allergies.   Review of Systems Review of Systems Pertinent negatives listed in HPI Physical Exam Triage Vital Signs ED Triage Vitals  Enc Vitals Group     BP 09/24/19 1720 (!) 164/80     Pulse Rate 09/24/19 1720 76     Resp 09/24/19 1720 18     Temp 09/24/19 1720 97.9 F (36.6 C)     Temp Source 09/24/19 1720 Oral     SpO2 09/24/19 1720 98 %     Weight --      Height --      Head Circumference --      Peak Flow --      Pain Score 09/24/19 1717 0     Pain Loc --      Pain Edu? --      Excl. in [place]? --    No data found.  Updated Vital Signs BP (!) 164/80 (BP Location: Left Arm)   Pulse 76   Temp 97.9 F (36.6 C) (Oral)   Resp 18   SpO2 98%   Visual Acuity Right Eye Distance:   Left Eye Distance:   Bilateral Distance:    Right Eye Near:   Left Eye Near:    Bilateral Near:     Physical Exam HENT:     Head: Normocephalic.     Nose: No congestion or rhinorrhea.     Mouth/Throat:     Mouth: Mucous membranes are moist.     Pharynx: Uvula midline. No pharyngeal swelling or posterior oropharyngeal erythema.     Tonsils: No tonsillar exudate.  Neck:     Trachea: Trachea normal. No tracheal tenderness, abnormal tracheal secretions or tracheal deviation.  Cardiovascular:     Rate and Rhythm: Normal rate and regular rhythm.  Pulmonary:     Effort: Pulmonary effort is normal.     Breath sounds: Normal breath sounds and air entry.  Musculoskeletal:     Cervical back: No rigidity. Normal range of motion.  Neurological:     Mental Status: He is alert.  Psychiatric:        Mood and Affect: Mood normal.      UC Treatments / Results  Labs (all labs ordered are listed, but only abnormal results are displayed) Labs Reviewed - No data to display  EKG    Radiology No results found.  Procedures Procedures (including critical care time)  Medications Ordered in UC Medications  alum & mag hydroxide-simeth (MAALOX/MYLANTA) [id number] MG/5ML suspension 30 mL (has no administration in  time range)    Initial Impression / Assessment and Plan / UC Course  I have reviewed the triage vital signs and the nursing notes.  Pertinent labs & imaging results that were available during my care of the patient were reviewed by me and considered in my medical decision making (see chart for details).  Dysphagia symptoms developed  today following eating a pancake and taking morning medications at the same time. Patient is s/p esophageal dilation procedure to correct esophageal stricture 09/17/19. Physical exam findings unremarkable today. Patient is swallowing appropriately and able to tolerated fluids without gagging. Offered a GI cocktail today to minus lidocaine as globus sensation could also be related to GERD symptoms. Patient tolerated GI cocktail. Agreed to follow-up at ER if any worsening symptoms developed. Reinforced the importance of avoid foods which are difficult to digest and increase risk of aspirating. Avoid taking all medication at the same time. Patient also advised to follow-up with Gastroenterologist to inquire if hospital follow-up appointment needs to be moved up sooner than scheduled appointment in February.     Final Clinical Impressions(s) / UC Diagnoses   Final diagnoses:  Dysphagia, unspecified type  Gastroesophageal reflux disease with esophagitis without hemorrhage     Discharge Instructions     Please follow-up with your GI specialist on Monday, Dr. Havery Moros. You may need to be seen in the office sooner than your scheduled February appointment.  Continue to limit foods that increase risk for choking. Please review the attached soft food recommendation. If you experience difficulty breathing, swallowing, persistent coughing  with food or fluid intake, follow-up immediately at the Emergency Department     ED Prescriptions    None     PDMP not reviewed this encounter.   Scot Jun, FNP 09/25/19 2137

## 2019-09-28 ENCOUNTER — Encounter: Payer: Self-pay | Admitting: *Deleted

## 2019-09-29 ENCOUNTER — Other Ambulatory Visit: Payer: Self-pay | Admitting: Family Medicine

## 2019-09-29 DIAGNOSIS — K219 Gastro-esophageal reflux disease without esophagitis: Secondary | ICD-10-CM

## 2019-09-29 NOTE — Telephone Encounter (Signed)
Requested Prescriptions  Pending Prescriptions Disp Refills  . omeprazole (PRILOSEC) 20 MG capsule [Pharmacy Med Name: OMEPRAZOLE DR 20 MG CAPSULE] 90 capsule 1    Sig: TAKE 1 CAPSULE BY MOUTH EVERY DAY     Gastroenterology: Proton Pump Inhibitors Passed - 09/29/2019  9:52 AM      Passed - Valid encounter within last 12 months    Recent Outpatient Visits          1 week ago Flank pain   Primary Care at [facility], Fenton Malling, MD   5 months ago Essential hypertension   Primary Care at [place], MD   10 months ago History of Helicobacter pylori infection   [facility] at [facility], Ranell Patrick, MD   12 months ago Medicare annual wellness visit, subsequent   [facility] at [facility], Ranell Patrick, MD   1 year ago Panic   Primary Care at [place], PA-C

## 2019-10-05 ENCOUNTER — Ambulatory Visit: Payer: Medicare PPO | Admitting: Gastroenterology

## 2019-10-05 ENCOUNTER — Encounter: Payer: Self-pay | Admitting: Gastroenterology

## 2019-10-05 VITALS — BP 150/84 | HR 80 | Temp 96.8°F | Ht 74.0 in | Wt 150.4 lb

## 2019-10-05 DIAGNOSIS — K228 Other specified diseases of esophagus: Secondary | ICD-10-CM | POA: Diagnosis not present

## 2019-10-05 DIAGNOSIS — R4702 Dysphasia: Secondary | ICD-10-CM | POA: Diagnosis not present

## 2019-10-05 DIAGNOSIS — K2289 Other specified disease of esophagus: Secondary | ICD-10-CM

## 2019-10-05 NOTE — Progress Notes (Signed)
10/05/2019 Chalon Elia [id number] [birth year]   HISTORY OF PRESENT ILLNESS: This is a pleasant 79 year old male who is a patient of Dr. Doyne Keel.  He is here today for a recent ER/endoscopy follow-up.  The patient presented to the ER on Christmas day with what they thought was a food impaction after eating pork chop.  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  Found to have mild gastritis as well.  Biopsies were all normal, negative for H. pylori and negative for eosinophilic esophagitis.  He is currently on omeprazole 20 mg daily.  He says that since the procedure he feels well.  He has been taking precautions when eating, chewing well, eating slowly, etc.  He has had issues with dysphagia in the past with dilation as well.  Had an esophageal manometry in 2010 that showed incomplete relaxation of the LES.  Previous barium esophagram showed esophageal dysmotility as well.   Past Medical History:  Diagnosis Date  . Anxiety   . Aortic valve insufficiency   . Asthma   . Cardiomyopathy ([place])   . Chest pain    per pt/ was resolved/ was [place] per pt.  . Deafness    Pt does sign language!  . Diverticulosis   . Dysphagia, unspecified(787.20)   . Esophageal spasm   . Esophageal stricture   . GERD (gastroesophageal reflux disease)    no meds  . H. pylori infection   . Hiatal hernia   . Hypertension   . Internal hemorrhoids   . Murmur, diastolic 0000000   U/S was ok per pt.  . S/P colonoscopy    Past Surgical History:  Procedure Laterality Date  . BIOPSY  09/17/2019   Procedure: BIOPSY;  Surgeon: Jackquline Denmark, MD;  Location: [facility] ENDOSCOPY;  Service: Endoscopy;;  . COLONOSCOPY    . ESOPHAGEAL DILATION  09/17/2019   Procedure: ESOPHAGEAL MALONEY DILATION;  Surgeon: Jackquline Denmark, MD;  Location: [facility] ENDOSCOPY;  Service: Endoscopy;;  . ESOPHAGOGASTRODUODENOSCOPY (EGD) WITH PROPOFOL N/A 09/17/2019   Procedure: ESOPHAGOGASTRODUODENOSCOPY  (EGD) WITH PROPOFOL;  Surgeon: Jackquline Denmark, MD;  Location: [facility] ENDOSCOPY;  Service: Endoscopy;  Laterality: N/A;  . TONSILLECTOMY     as a child  . UPPER GASTROINTESTINAL ENDOSCOPY      reports that he quit smoking about 34 years ago. His smoking use included cigarettes. He has never used smokeless tobacco. He reports that he does not drink alcohol or use drugs. family history includes Cancer in his mother; Diabetes in his maternal grandfather, maternal grandmother, and maternal uncle; Uterine cancer in his maternal aunt and mother. No Known Allergies    Outpatient Encounter Medications as of 10/05/2019  Medication Sig  . ALPRAZolam (XANAX) 0.5 MG tablet Take 0.5-1 tablets (0.25-0.5 mg total) by mouth daily as needed (Acute panic only.).  Marland Kitchen aspirin 81 MG tablet Take 81 mg by mouth daily.    Marland Kitchen omeprazole (PRILOSEC) 20 MG capsule TAKE 1 CAPSULE BY MOUTH EVERY DAY  . [DISCONTINUED] diltiazem (CARDIZEM CD) 180 MG 24 hr capsule Take 1 capsule (180 mg total) by mouth daily.  . [DISCONTINUED] FLOVENT HFA 44 MCG/ACT inhaler TAKE 2 PUFFS BY MOUTH TWICE A DAY  . [DISCONTINUED] ofloxacin (OCUFLOX) 0.3 % ophthalmic solution Instill 1 drop TID in operative eye starting 2 days prior to surgery and after surgery for 3 weeks.  . [DISCONTINUED] polyethylene glycol powder (GLYCOLAX/MIRALAX) powder Take 17 g by mouth 2 (two) times daily as needed. (Patient not taking: Reported  on 09/22/2019)  . [DISCONTINUED] traMADol (ULTRAM) 50 MG tablet Take 1 every 8 hours only if needed for severe pain.  Take with food.   Facility-Administered Encounter Medications as of 10/05/2019  Medication  . 0.9 %  sodium chloride infusion     REVIEW OF SYSTEMS  : All other systems reviewed and negative except where noted in the History of Present Illness.   PHYSICAL EXAM: BP (!) 150/84   Pulse 80 Comment: irregular  Temp (!) 96.8 F (36 C)   Ht 6\' 2"  (1.88 m)   Wt 150 lb 6 oz (68.2 kg)   BMI 19.31 kg/m  General: Well  developed male in no acute distress Head: Normocephalic and atraumatic Eyes:  Sclerae anicteric, conjunctiva pink. Ears: Hearing impaired. Lungs: Clear throughout to auscultation; no increased WOB. Heart: Regular rate and rhythm; no M/R/G. Musculoskeletal: Symmetrical with no gross deformities  Skin: No lesions on visible extremities Extremities: No edema  Neurological: Alert oriented x 4, grossly non-focal Psychological:  Alert and cooperative. 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  Esophageal manometry in the past (2010) demonstrated incomplete relaxation of the LES and previous barium study showing esophageal dysmotility.  Feels better after recent dilation and omeprazole 20 mg daily.  He will continue the PPI and needs to continue to take precautions with eating.  He understands.  He will call back with any further issues.  If continues to have problems may want to consider repeating manometry study.  **Entire visit today was performed via sign language interpreter service as the patient is hearing impaired.   CC:  Wendie Agreste, MD

## 2019-10-05 NOTE — Progress Notes (Signed)
Agree with assessment and plan. Possible dysmotility caused his transient impacted. Would consider a repeat barium study again with tablet at some point vs. Manometry if he is interesting in pursuing further workup for this issue and symptoms persist.

## 2019-10-05 NOTE — Patient Instructions (Signed)
Continue omeprazole daily.  Please follow up as needed.  If you are age 80 or older, your body mass index should be between 23-30. Your Body mass index is 19.31 kg/m. If this is out of the aforementioned range listed, please consider follow up with your Primary Care Provider.  If you are age 73 or younger, your body mass index should be between 19-25. Your Body mass index is 19.31 kg/m. If this is out of the aformentioned range listed, please consider follow up with your Primary Care Provider.

## 2019-10-27 ENCOUNTER — Other Ambulatory Visit: Payer: Self-pay | Admitting: Family Medicine

## 2019-10-27 ENCOUNTER — Ambulatory Visit: Payer: Medicare Other | Admitting: Gastroenterology

## 2019-10-27 DIAGNOSIS — K219 Gastro-esophageal reflux disease without esophagitis: Secondary | ICD-10-CM

## 2019-10-27 MED ORDER — OMEPRAZOLE 20 MG PO CPDR: 20.0000 mg | DELAYED_RELEASE_CAPSULE | Freq: Every day | ORAL | 0 refills | Status: DC

## 2019-10-27 NOTE — Telephone Encounter (Signed)
Notes to clinic:  please see telephone encounter dated 10/27/19;  need clarification of orders    Requested Prescriptions  Pending Prescriptions Disp Refills   omeprazole (PRILOSEC) 20 MG capsule      Sig: Take by mouth daily.      Gastroenterology: Proton Pump Inhibitors Passed - 10/27/2019 12:06 PM      Passed - Valid encounter within last 12 months    Recent Outpatient Visits           1 month ago Flank pain   Primary Care at [facility], Fenton Malling, MD   6 months ago Essential hypertension   Primary Care at [place], MD   11 months ago History of Helicobacter pylori infection   Primary Care at [facility], Ranell Patrick, MD   1 year ago Medicare annual wellness visit, subsequent   [facility] Care at [facility], Ranell Patrick, MD   1 year ago Panic   Primary Care at [place], PA-C

## 2019-10-27 NOTE — Telephone Encounter (Signed)
Medication Refill - Medication: omeprazole (PRILOSEC) 20 MG capsule   Patient requests a 90 day supply  Preferred Pharmacy (with phone number or street name):  [id number] - [place], [place] Phone:  [phone]  Fax:  [phone]       Agent: Please be advised that RX refills may take up to 3 business days. We ask that you follow-up with your pharmacy.

## 2019-10-27 NOTE — Telephone Encounter (Signed)
Spoke with Ailene Ravel at [facility]; she states that the pt says he takes medication twice daily; his prescription reads take once daily; will route to office for clarification of orders.

## 2019-10-29 ENCOUNTER — Ambulatory Visit: Payer: Medicare Other | Admitting: Gastroenterology

## 2019-11-05 ENCOUNTER — Ambulatory Visit: Payer: Medicare PPO | Attending: Internal Medicine

## 2019-11-05 DIAGNOSIS — Z23 Encounter for immunization: Secondary | ICD-10-CM | POA: Insufficient documentation

## 2019-11-05 NOTE — Progress Notes (Signed)
   Covid-19 Vaccination Clinic  Name:  Bradley Ramirez    MRN: [id number] DOB: [birth year]  11/05/2019  [name] was observed post Covid-19 immunization for 15 minutes without incidence. He was provided with Vaccine Information Sheet and instruction to access the V-Safe system.   [name] was instructed to call 911 with any severe reactions post vaccine: Marland Kitchen Difficulty breathing  . Swelling of your face and throat  . A fast heartbeat  . A bad rash all over your body  . Dizziness and weakness    Immunizations Administered    Name Date Dose VIS Date Route   Pfizer COVID-19 Vaccine 11/05/2019  3:25 PM 0.3 mL 09/03/2019 Intramuscular   Manufacturer: [place]   Lot: [id number]   [place]: [id number]

## 2019-11-15 ENCOUNTER — Telehealth: Payer: Self-pay | Admitting: Family Medicine

## 2019-11-15 NOTE — Telephone Encounter (Signed)
Patient's daughter is calling to report that the patient is having to take 2 pills daily of omeprazole (PRILOSEC) 20 MG capsule [id number]   for relief  Patient is out of medication. Please advise [phone]. Preferred Pharmacy- [facility]

## 2019-11-16 NOTE — Telephone Encounter (Signed)
Called patient no answer lvm

## 2019-11-17 ENCOUNTER — Ambulatory Visit: Payer: Medicare PPO | Admitting: Family Medicine

## 2019-11-17 ENCOUNTER — Other Ambulatory Visit: Payer: Self-pay

## 2019-11-17 ENCOUNTER — Encounter: Payer: Self-pay | Admitting: Family Medicine

## 2019-11-17 VITALS — BP 144/82 | HR 75 | Temp 97.7°F | Ht 74.0 in | Wt 152.0 lb

## 2019-11-17 DIAGNOSIS — K2970 Gastritis, unspecified, without bleeding: Secondary | ICD-10-CM | POA: Diagnosis not present

## 2019-11-17 DIAGNOSIS — K224 Dyskinesia of esophagus: Secondary | ICD-10-CM | POA: Diagnosis not present

## 2019-11-17 DIAGNOSIS — K228 Other specified diseases of esophagus: Secondary | ICD-10-CM

## 2019-11-17 DIAGNOSIS — K2289 Other specified disease of esophagus: Secondary | ICD-10-CM

## 2019-11-17 MED ORDER — OMEPRAZOLE 20 MG PO CPDR: 20.0000 mg | DELAYED_RELEASE_CAPSULE | Freq: Two times a day (BID) | ORAL | 0 refills | Status: DC

## 2019-11-17 NOTE — Progress Notes (Signed)
Subjective:  Patient ID: Bradley Ramirez, male    DOB: December 17, 1940  Age: 79 y.o. MRN: [id number] Sign language interpreter present throughout visit. CC:  Chief Complaint  Patient presents with  . Gastroesophageal Reflux    stared back in october. pt states pork chops will agrivate it. pt reports it gets agravated in the morning.     HPI Bradley Ramirez presents for   GERD:   Started about 2 months ago. Urgent care visit last month. ER on 12/25 with possible food impaction. EGD without impaction, but presbyesophagus and esophagus dilated. Mild gastritis. Biopsies normal and negative H pylori per GI notes. manometry in 2010 with incomplete relaxation of LES, and esophageal dysmotility on prior barium esophagram. Continued on omeprazole 20mg  QD, but he has been taking 2 per day for relief of symptoms. That has been working well - incomplete relief with just 1 in am. No chest tightness with 2 per day. noted GI plan for " repeat barium study again with tablet at some point vs. Manometry if he is interesting in pursuing further workup for this issue and symptoms persist".    History Patient Active Problem List   Diagnosis Date Noted  . Dysphasia 10/05/2019  . 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 Esophageal obstruction due to food impaction   . Esophageal stricture   . Constipation 11/24/2017  . Deaf 10/28/2013  . CARDIOMYOPATHY 04/20/2009  . HYPERTENSION, UNSPECIFIED 04/19/2009  . HIATAL HERNIA, HX OF 04/19/2009   Past Medical History:  Diagnosis Date  . Anxiety   . Aortic valve insufficiency   . Asthma   . Cardiomyopathy ([place])   . Chest pain    per pt/ was resolved/ was [place] per pt.  . Deafness    Pt does sign language!  . Diverticulosis   . Dysphagia, unspecified(787.20)   . Esophageal spasm   . Esophageal stricture   . GERD (gastroesophageal reflux disease)    no meds  . H. pylori infection   . Hiatal hernia   . Hypertension   . Internal hemorrhoids   . Murmur, diastolic  0000000   U/S was ok per pt.  . S/P colonoscopy    Past Surgical History:  Procedure Laterality Date  . BIOPSY  09/17/2019   Procedure: BIOPSY;  Surgeon: Jackquline Denmark, MD;  Location: [facility] ENDOSCOPY;  Service: Endoscopy;;  . COLONOSCOPY    . ESOPHAGEAL DILATION  09/17/2019   Procedure: ESOPHAGEAL MALONEY DILATION;  Surgeon: Jackquline Denmark, MD;  Location: [facility] ENDOSCOPY;  Service: Endoscopy;;  . ESOPHAGOGASTRODUODENOSCOPY (EGD) WITH PROPOFOL N/A 09/17/2019   Procedure: ESOPHAGOGASTRODUODENOSCOPY (EGD) WITH PROPOFOL;  Surgeon: Jackquline Denmark, MD;  Location[facility] ENDOSCOPY;  Service: Endoscopy;  Laterality: N/A;  . TONSILLECTOMY     as a child  . UPPER GASTROINTESTINAL ENDOSCOPY     No Known Allergies Prior to Admission medications   Medication Sig Start Date End Date Taking? Authorizing Provider  ALPRAZolam Duanne Moron) 0.5 MG tablet Take 0.5-1 tablets (0.25-0.5 mg total) by mouth daily as needed (Acute panic only.). 01/09/18  Yes Tereasa Coop, PA-C  aspirin 81 MG tablet Take 81 mg by mouth daily.     Yes [provider]  omeprazole (PRILOSEC) 20 MG capsule Take 1 capsule (20 mg total) by mouth daily. 10/27/19  Yes Wendie Agreste, MD   Social History   Socioeconomic History  . Marital status: Married    Spouse name: Not on file  . Number of children: 4  . Years of education: Not on file  .  Highest education level: Some college, no degree  Occupational History  . Occupation: retired    Fish farm manager: [employer]  Tobacco Use  . Smoking status: Former Smoker    Types: Cigarettes    Quit date: 1987    Years since quitting: 34.1  . Smokeless tobacco: Never Used  Substance and Sexual Activity  . Alcohol use: No    Comment: quit 1987  . Drug use: No  . Sexual activity: Not on file  Other Topics Concern  . 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 Difficulty of Paying  Living Expenses: Not on file  Food Insecurity:   . Worried About Charity fundraiser in the Last Year: Not on file  . Ran Out of Food in the Last Year: Not on file  Transportation Needs:   . Lack of Transportation (Medical): Not on file  . Lack of Transportation (Non-Medical): Not on file  Physical Activity:   . Days of Exercise per Week: Not on file  . Minutes of Exercise per Session: Not on file  Stress:   . Feeling of Stress : Not on file  Social Connections:   . Frequency of Communication with Friends and Family: Not on file  . Frequency of Social Gatherings with Friends and Family: Not on file  . Attends Religious Services: Not on file  . Active Member of Clubs or Organizations: Not on file  . Attends Archivist Meetings: Not on file  . Marital Status: Not on file  Intimate Partner Violence:   . Fear of Current or Ex-Partner: Not on file  . Emotionally Abused: Not on file  . Physically Abused: Not on file  . Sexually Abused: Not on file    Review of Systems Per HPI.   Objective:   Vitals:   11/17/19 1605 11/17/19 1607  BP: (!) 161/86 (!) 144/82  Pulse: 75   Temp: 97.7 F (36.5 C)   TempSrc: Temporal   SpO2: 99%   Weight: 152 lb (68.9 kg)   Height: 6\' 2"  (1.88 m)      Physical Exam Vitals reviewed.  Constitutional:      Appearance: Normal appearance. He is well-developed.  HENT:     Head: Normocephalic and atraumatic.  Eyes:     Pupils: Pupils are equal, round, and reactive to light.  Neck:     Vascular: No carotid bruit or JVD.  Cardiovascular:     Rate and Rhythm: Normal rate and regular rhythm.     Heart sounds: Normal heart sounds. No murmur.  Pulmonary:     Effort: Pulmonary effort is normal.     Breath sounds: Normal breath sounds. No rales.  Skin:    General: Skin is warm and dry.  Neurological:     Mental Status: He is alert and oriented to person, place, and time.        Assessment & Plan:  Bradley Ramirez is a 79 y.o.  male . Esophageal dysmotility  Presbyesophagus - Plan: omeprazole (PRILOSEC) 20 MG capsule  Gastritis without bleeding, unspecified chronicity, unspecified gastritis type  Continue omeprazole twice daily for now but recommended gastroenterology follow-up as that is higher dosing than recommended at their last visit.  Additionally may need to be scheduled for other imaging/testing as above.  Continue to chew food thoroughly, small bites to minimize risk of  impaction.  RTC/ER precautions with understanding expressed  No orders of the defined types were placed in this encounter.  Patient Instructions       If you have lab work done today you will be contacted with your lab results within the next 2 weeks.  If you have not heard from [place] then please contact us. The fastest way to get your results is to register for My Chart.   IF you received an x-ray today, you will receive an invoice from [facility]. Please contact [facility] at [phone] with questions or concerns regarding your invoice.   IF you received labwork today, you will receive an invoice from [place]. Please contact [facility] at [phone] with questions or concerns regarding your invoice.   Our billing staff will not be able to assist you with questions regarding bills from these companies.  You will be contacted with the lab results as soon as they are available. The fastest way to get your results is to activate your My Chart account. Instructions are located on the last page of this paperwork. If you have not heard from [place] regarding the results in 2 weeks, please contact this office.         Signed, [name], MD [facility]

## 2019-11-17 NOTE — Patient Instructions (Addendum)
  Ok to continue same dose omeprazole for now. Call gastroenterologist to discuss ned for twice per day meds and possible other studies. Return to the clinic or go to the nearest emergency room if any of your symptoms worsen or new symptoms occur.  I did refill meds for now.     If you have lab work done today you will be contacted with your lab results within the next 2 weeks.  If you have not heard from [place] then please contact us. The fastest way to get your results is to register for My Chart.   IF you received an x-ray today, you will receive an invoice from [facility]. Please contact [facility] at [phone] with questions or concerns regarding your invoice.   IF you received labwork today, you will receive an invoice from [place]. Please contact [facility] at [phone] with questions or concerns regarding your invoice.   Our billing staff will not be able to assist you with questions regarding bills from these companies.  You will be contacted with the lab results as soon as they are available. The fastest way to get your results is to activate your My Chart account. Instructions are located on the last page of this paperwork. If you have not heard from [place] regarding the results in 2 weeks, please contact this office.

## 2019-11-28 ENCOUNTER — Ambulatory Visit: Payer: Medicare PPO | Attending: Internal Medicine

## 2019-11-28 DIAGNOSIS — Z23 Encounter for immunization: Secondary | ICD-10-CM

## 2019-11-28 NOTE — Progress Notes (Signed)
   Covid-19 Vaccination Clinic  Name:  Bradley Ramirez    MRN: [id number] DOB: 06-Sep-1941  11/28/2019  Mr. Bradley Ramirez was observed post Covid-19 immunization for 15 minutes without incident. He was provided with Vaccine Information Sheet and instruction to access the V-Safe system.   Mr. Bradley Ramirez was instructed to call 911 with any severe reactions post vaccine: Marland Kitchen Difficulty breathing  . Swelling of face and throat  . A fast heartbeat  . A bad rash all over body  . Dizziness and weakness   Immunizations Administered    Name Date Dose VIS Date Route   Pfizer COVID-19 Vaccine 11/28/2019  1:03 PM 0.3 mL 09/03/2019 Intramuscular   Manufacturer: [place]   Lot: [id number]   [place]: [id number]

## 2019-12-11 ENCOUNTER — Other Ambulatory Visit: Payer: Self-pay | Admitting: Family Medicine

## 2019-12-11 DIAGNOSIS — I10 Essential (primary) hypertension: Secondary | ICD-10-CM

## 2020-02-08 ENCOUNTER — Telehealth: Payer: Self-pay | Admitting: Cardiology

## 2020-02-08 NOTE — Telephone Encounter (Signed)
Contact patient 02/08/20 to schedule for appointment, left message

## 2020-02-09 ENCOUNTER — Other Ambulatory Visit: Payer: Self-pay | Admitting: Family Medicine

## 2020-02-09 DIAGNOSIS — K2289 Other specified disease of esophagus: Secondary | ICD-10-CM

## 2020-02-09 NOTE — Telephone Encounter (Signed)
Requested Prescriptions  Pending Prescriptions Disp Refills  . omeprazole (PRILOSEC) 20 MG capsule [Pharmacy Med Name: OMEPRAZOLE DR 20 MG CAPSULE] 180 capsule 0    Sig: TAKE 1 CAPSULE (20 MG TOTAL) BY MOUTH 2 (TWO) TIMES DAILY BEFORE A MEAL.     Gastroenterology: Proton Pump Inhibitors Passed - 02/09/2020 12:06 AM      Passed - Valid encounter within last 12 months    Recent Outpatient Visits          2 months ago Esophageal dysmotility   [facility], Ranell Patrick, MD   4 months ago Flank pain   [facility] at [facility], Fenton Malling, MD   9 months ago Essential hypertension   [facility], Ranell Patrick, MD   1 year ago History of Helicobacter pylori infection   [facility], Ranell Patrick, MD   1 year ago Medicare annual wellness visit, subsequent   [facility], Ranell Patrick, MD

## 2020-02-11 ENCOUNTER — Telehealth: Payer: Self-pay | Admitting: Cardiology

## 2020-02-11 NOTE — Telephone Encounter (Signed)
    I went back in pt;s chart to see if I had did a previous message on why I was in pt's chart.

## 2020-02-11 NOTE — Telephone Encounter (Signed)
     I went in pt's chart to see who called him.

## 2020-03-14 DIAGNOSIS — Z961 Presence of intraocular lens: Secondary | ICD-10-CM | POA: Diagnosis not present

## 2020-04-07 IMAGING — DX DG CHEST 2V
2 series · 2 of 2 positions shown · non-contrast
Comparison: 09/26/2016

CLINICAL DATA: Chest tightness with nausea.

EXAM:
CHEST - 2 VIEW

[chest pa]
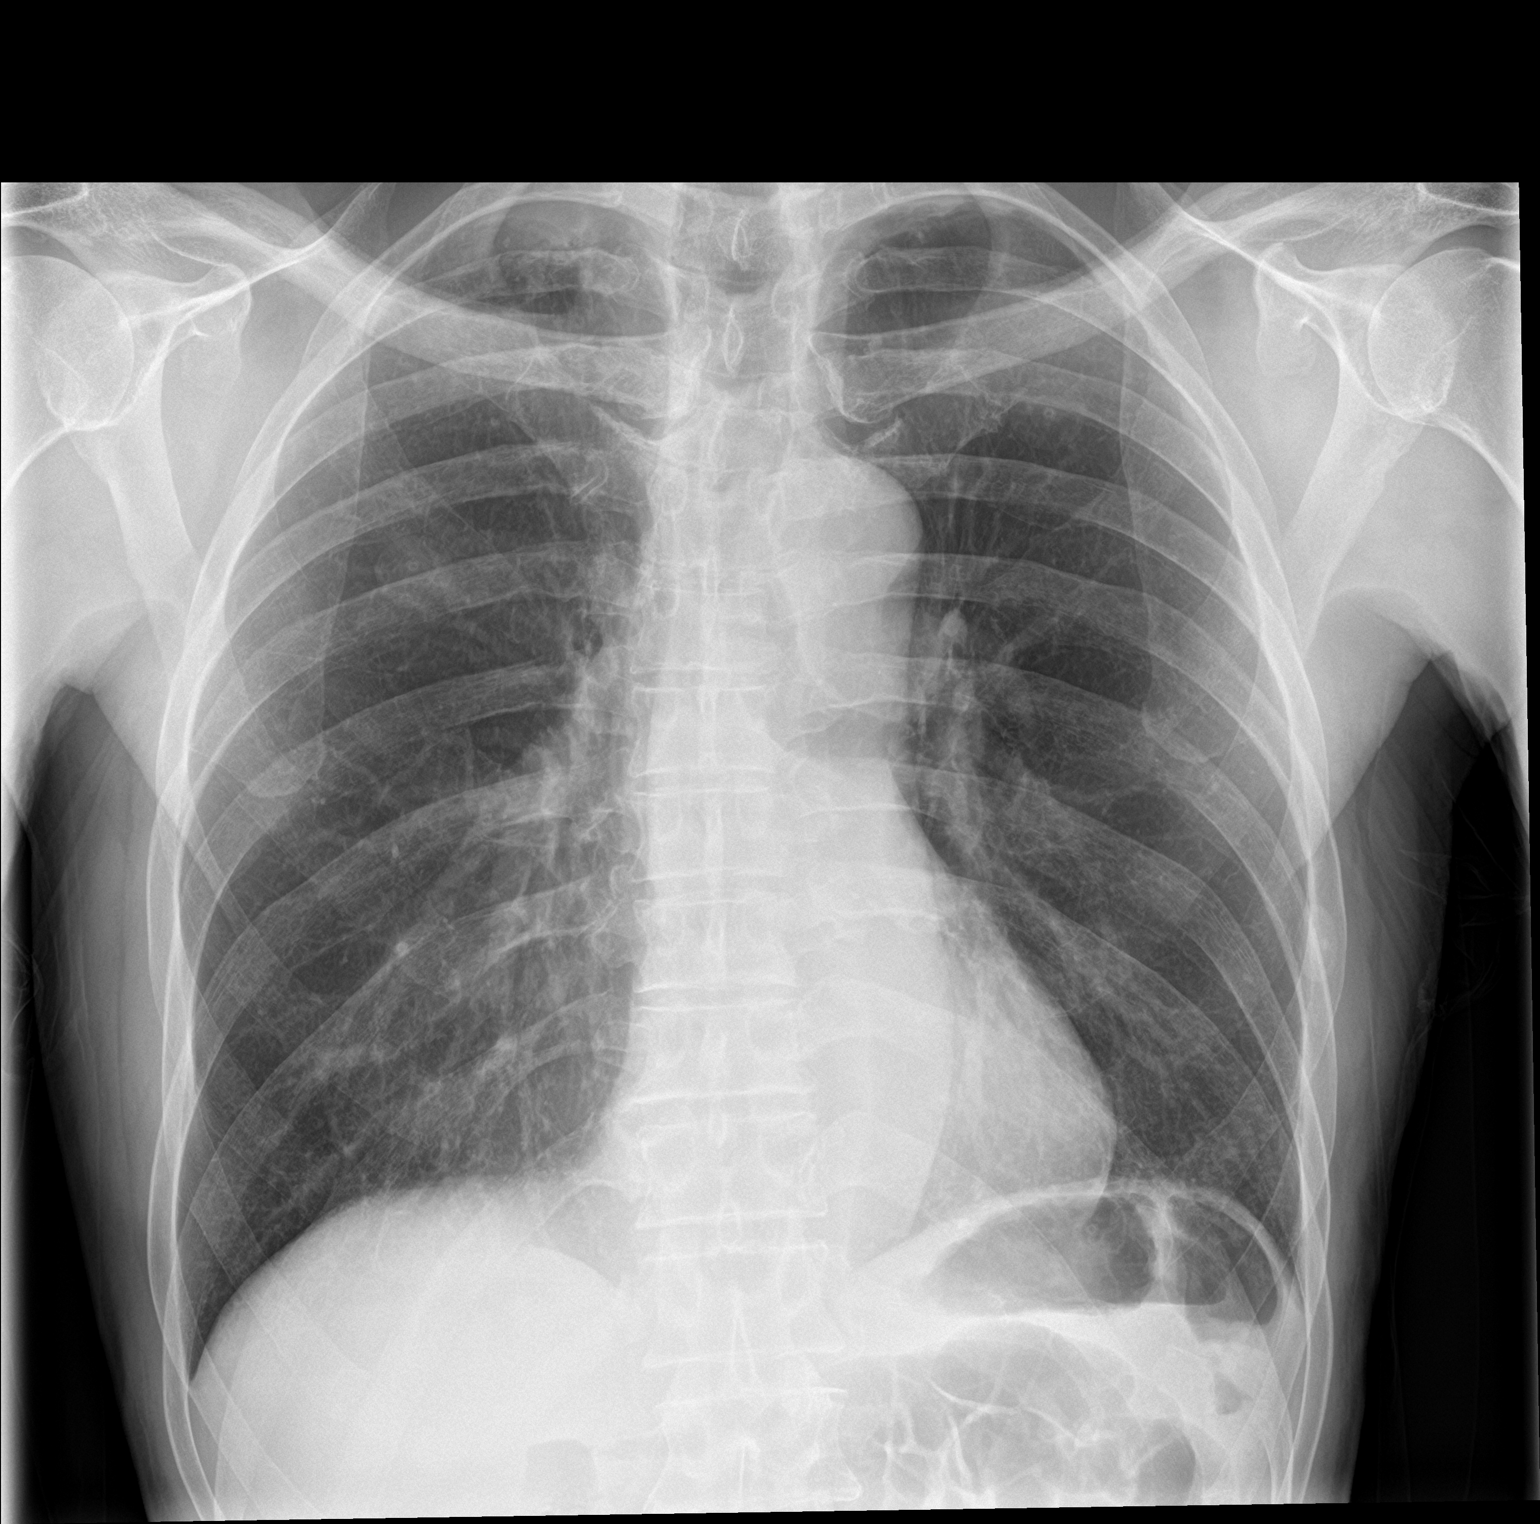

[chest lat]
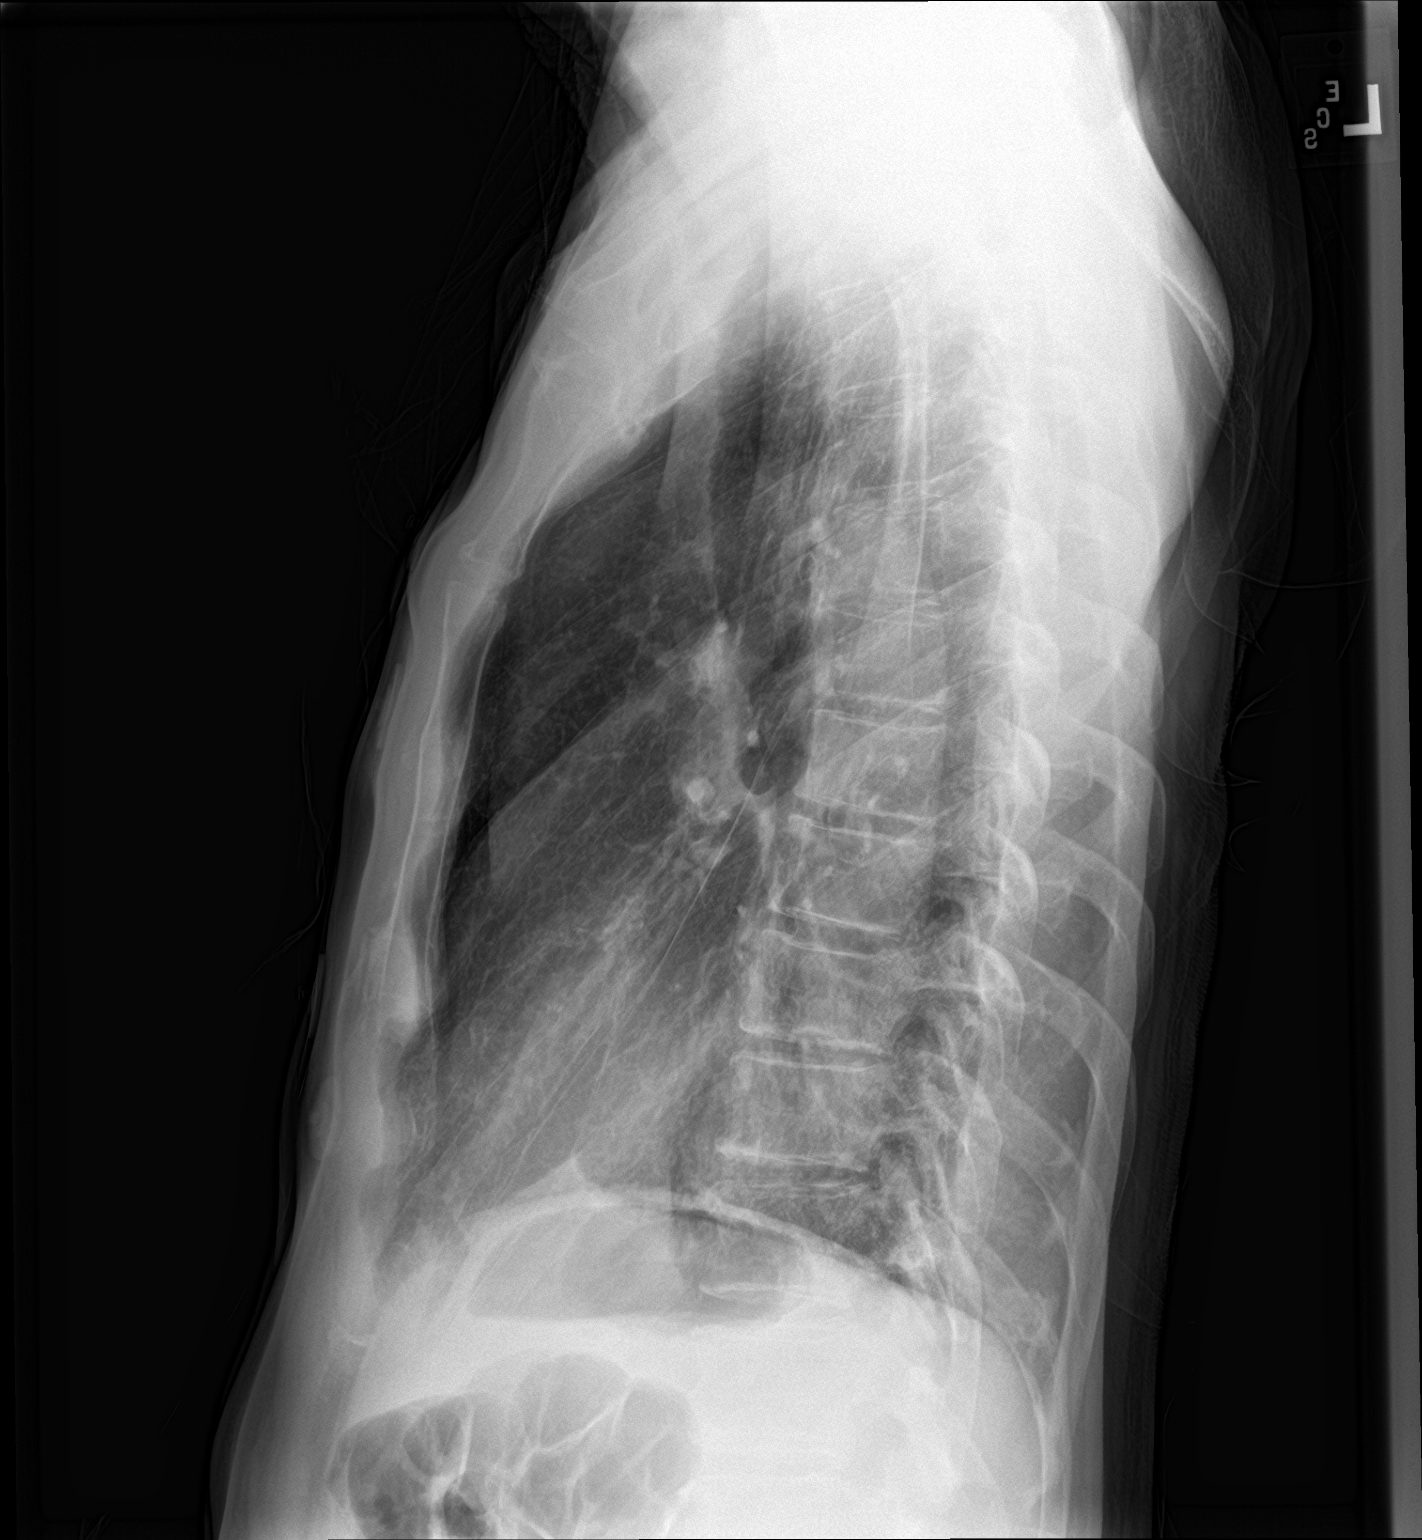

[2 of 2 positions shown; findings below may reference images not displayed]

FINDINGS: Lungs are hyperexpanded. The lungs are clear without focal
pneumonia, edema, pneumothorax or pleural effusion. Asymmetric
biapical scarring is stable in the interval. The cardiopericardial
silhouette is within normal limits for size. Nodular
density/densities projecting over the lungs are compatible with pads
for telemetry leads. The visualized bony structures of the thorax
are intact.
IMPRESSION: 1. Emphysema without acute cardiopulmonary findings.

## 2020-05-11 ENCOUNTER — Encounter: Payer: Self-pay | Admitting: Cardiology

## 2020-05-11 DIAGNOSIS — I420 Dilated cardiomyopathy: Secondary | ICD-10-CM | POA: Insufficient documentation

## 2020-05-11 DIAGNOSIS — I499 Cardiac arrhythmia, unspecified: Secondary | ICD-10-CM | POA: Diagnosis not present

## 2020-05-11 DIAGNOSIS — G3184 Mild cognitive impairment, so stated: Secondary | ICD-10-CM | POA: Diagnosis not present

## 2020-05-11 DIAGNOSIS — I351 Nonrheumatic aortic (valve) insufficiency: Secondary | ICD-10-CM | POA: Insufficient documentation

## 2020-05-11 DIAGNOSIS — R030 Elevated blood-pressure reading, without diagnosis of hypertension: Secondary | ICD-10-CM | POA: Diagnosis not present

## 2020-05-11 DIAGNOSIS — K08109 Complete loss of teeth, unspecified cause, unspecified class: Secondary | ICD-10-CM | POA: Diagnosis not present

## 2020-05-11 DIAGNOSIS — R0602 Shortness of breath: Secondary | ICD-10-CM | POA: Diagnosis not present

## 2020-05-11 DIAGNOSIS — Z7951 Long term (current) use of inhaled steroids: Secondary | ICD-10-CM | POA: Diagnosis not present

## 2020-05-11 DIAGNOSIS — Z7189 Other specified counseling: Secondary | ICD-10-CM | POA: Insufficient documentation

## 2020-05-11 DIAGNOSIS — Z87891 Personal history of nicotine dependence: Secondary | ICD-10-CM | POA: Diagnosis not present

## 2020-05-11 DIAGNOSIS — K219 Gastro-esophageal reflux disease without esophagitis: Secondary | ICD-10-CM | POA: Diagnosis not present

## 2020-05-11 DIAGNOSIS — Z7722 Contact with and (suspected) exposure to environmental tobacco smoke (acute) (chronic): Secondary | ICD-10-CM | POA: Diagnosis not present

## 2020-05-11 DIAGNOSIS — Z809 Family history of malignant neoplasm, unspecified: Secondary | ICD-10-CM | POA: Diagnosis not present

## 2020-05-11 NOTE — Progress Notes (Signed)
Cardiology Office Note   Date:  05/12/2020   ID:  Bradley Ramirez, DOB Oct 25, 1940, MRN [id number]  PCP:  Wendie Agreste, MD  Cardiologist:   Minus Breeding, MD  Chief Complaint  Patient presents with  . Heart Murmur      History of Present Illness: Bradley Ramirez is a 79 y.o. 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  His ejection fraction was 35% at one time.  His EF was 50% with mild AI in 2019.  Since I last saw him he has done very well.  He continues to work in [place] in [employer].  He stays very active.   The patient denies any new symptoms such as chest discomfort, neck or arm discomfort. There has been no new shortness of breath, PND or orthopnea. There have been no reported palpitations, presyncope or syncope.    Past Medical History:  Diagnosis Date  . Anxiety   . Aortic valve insufficiency   . Asthma   . Cardiomyopathy ([place])   . Chest pain    per pt/ was resolved/ was [place] per pt.  . Deafness    Pt does sign language!  . Diverticulosis   . Dysphagia, unspecified(787.20)   . Esophageal spasm   . Esophageal stricture   . GERD (gastroesophageal reflux disease)    no meds  . H. pylori infection   . Hiatal hernia   . Hypertension   . Internal hemorrhoids   . S/P colonoscopy     Past Surgical History:  Procedure Laterality Date  . BIOPSY  09/17/2019   Procedure: BIOPSY;  Surgeon: Jackquline Denmark, MD;  Location: [facility] ENDOSCOPY;  Service: Endoscopy;;  . COLONOSCOPY    . ESOPHAGEAL DILATION  09/17/2019   Procedure: ESOPHAGEAL MALONEY DILATION;  Surgeon: Jackquline Denmark, MD;  Location: [facility] ENDOSCOPY;  Service: Endoscopy;;  . ESOPHAGOGASTRODUODENOSCOPY (EGD) WITH PROPOFOL N/A 09/17/2019   Procedure: ESOPHAGOGASTRODUODENOSCOPY (EGD) WITH PROPOFOL;  Surgeon: Jackquline Denmark, MD;  Location: [facility] ENDOSCOPY;  Service: Endoscopy;  Laterality: N/A;  . TONSILLECTOMY     as a child  . UPPER GASTROINTESTINAL ENDOSCOPY        Current Outpatient Medications  Medication Sig Dispense Refill  . ALPRAZolam (XANAX) 0.5 MG tablet Take 0.5-1 tablets (0.25-0.5 mg total) by mouth daily as needed (Acute panic only.). 4 tablet 0  . fluticasone (FLOVENT HFA) 44 MCG/ACT inhaler Inhale 2 puffs into the lungs 2 (two) times daily.    Marland Kitchen omeprazole (PRILOSEC) 20 MG capsule TAKE 1 CAPSULE (20 MG TOTAL) BY MOUTH 2 (TWO) TIMES DAILY BEFORE A MEAL. 180 capsule 3   No current facility-administered medications for this visit.    Allergies:   Patient has no known allergies.    ROS:  Please see the history of present illness.   Otherwise, review of systems are positive for none.   All other systems are reviewed and negative.    PHYSICAL EXAM: VS:  BP 126/70   Pulse 67   Ht 6\' 2"  (1.88 m)   Wt 150 lb 3.2 oz (68.1 kg)   SpO2 99%   BMI 19.28 kg/m  , BMI Body mass index is 19.28 kg/m. GENERAL:  Well appearing NECK:  No jugular venous distention, waveform within normal limits, carotid upstroke brisk and symmetric, no bruits, no thyromegaly LUNGS:  Clear to auscultation bilaterally CHEST:  Unremarkable HEART:  PMI not displaced or sustained,S1 and S2 within normal limits, no S3, no S4, no clicks,  no rubs, 3 out of 6 diastolic murmur heard best at the third left intercostal space and lasting well into diastole, no systolic murmurs ABD:  Flat, positive bowel sounds normal in frequency in pitch, no bruits, no rebound, no guarding, no midline pulsatile mass, no hepatomegaly, no splenomegaly EXT:  2 plus pulses throughout, no edema, no cyanosis no clubbing   EKG:  EKG is ordered today. The ekg ordered today demonstrates sinus rhythm with premature atrial contractions, rate 67, axis within normal limits, intervals within normal limits, no acute ST-T wave changes.   Recent Labs: 09/17/2019: BUN 21; Creatinine, Ser 1.74; Hemoglobin 11.6; Platelets 202; Potassium 4.0; Sodium 139    Lipid Panel    Component Value Date/Time    CHOL 186 09/29/2017 1030   TRIG 75 09/29/2017 1030   HDL 57 09/29/2017 1030   CHOLHDL 3.3 09/29/2017 1030   CHOLHDL 2.9 07/17/2015 1415   VLDL 13 07/17/2015 1415   LDLCALC 114 (H) 09/29/2017 1030      Wt Readings from Last 3 Encounters:  05/12/20 150 lb 3.2 oz (68.1 kg)  11/17/19 152 lb (68.9 kg)  10/05/19 150 lb 6 oz (68.2 kg)      Other studies Reviewed: Additional studies/ records that were reviewed today include: None. Review of the above records demonstrates:  Please see elsewhere in the note.     ASSESSMENT AND PLAN:   CARDIOMYOPATHY -  His ejection fraction was low normal in 2019.  He has no new symptoms.  He seems to be euvolemic.  I will follow up probably with an echocardiogram prior to his visit next year.  HYPERTENSION, UNSPECIFIED -  The blood pressure is well controlled.  No change in therapy.  RISK REDUCTION -  We did talk about the risks and benefits of aspirin.  I suggested that he does not need to take this based on his current low 10-year risk.  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  I appreciate the murmur but would not suspect it was different clinically.  I will check an echocardiogram next year as well as been a few years but there is no reason to do this in the middle of the pandemic.  COVID EDUCATION - He has had his vaccine.  We talked about the booster.   Current medicines are reviewed at length with the patient today.  The patient does not have concerns regarding medicines.  The following changes have been made:  no change  Labs/ tests ordered today include:   Orders Placed This Encounter  Procedures  . EKG 12-Lead  . ECHOCARDIOGRAM COMPLETE     Disposition:   FU with me in one year.     Signed, Minus Breeding, MD  05/12/2020 1:21 PM    [facility]

## 2020-05-12 ENCOUNTER — Ambulatory Visit (INDEPENDENT_AMBULATORY_CARE_PROVIDER_SITE_OTHER): Payer: Medicare PPO | Admitting: Cardiology

## 2020-05-12 ENCOUNTER — Encounter: Payer: Self-pay | Admitting: Cardiology

## 2020-05-12 ENCOUNTER — Other Ambulatory Visit: Payer: Self-pay

## 2020-05-12 VITALS — BP 126/70 | HR 67 | Ht 74.0 in | Wt 150.2 lb

## 2020-05-12 DIAGNOSIS — I420 Dilated cardiomyopathy: Secondary | ICD-10-CM | POA: Diagnosis not present

## 2020-05-12 DIAGNOSIS — Z7189 Other specified counseling: Secondary | ICD-10-CM

## 2020-05-12 DIAGNOSIS — I351 Nonrheumatic aortic (valve) insufficiency: Secondary | ICD-10-CM

## 2020-05-12 DIAGNOSIS — I10 Essential (primary) hypertension: Secondary | ICD-10-CM

## 2020-05-12 NOTE — Patient Instructions (Addendum)
Medication Instructions:  Stop Aspirin *If you need a refill on your cardiac medications before your next appointment, please call your pharmacy*  Lab Work: None ordered this visit  Testing/Procedures: Your physician has requested that you have an echocardiogram in 2022 before follow up appointment. Echocardiography is a painless test that uses sound waves to create images of your heart. It provides your doctor with information about the size and shape of your heart and how well your heart's chambers and valves are working. This procedure takes approximately one hour. There are no restrictions for this procedure. [address]  Follow-Up: At [facility], you and your health needs are our priority.  As part of our continuing mission to provide you with exceptional heart care, we have created designated Provider Care Teams.  These Care Teams include your primary Cardiologist (physician) and Advanced Practice Providers (APPs -  Physician Assistants and Nurse Practitioners) who all work together to provide you with the care you need, when you need it.  We recommend signing up for the patient portal called "MyChart".  Sign up information is provided on this After Visit Summary.  MyChart is used to connect with patients for Virtual Visits (Telemedicine).  Patients are able to view lab/test results, encounter notes, upcoming appointments, etc.  Non-urgent messages can be sent to your provider as well.   To learn more about what you can do with MyChart, go to [email or web address].    Your next appointment:   12 month(s) after Echo You will receive a reminder letter in the mail two months in advance. If you don't receive a letter, please call our office to schedule the follow-up appointment.  The format for your next appointment:   In Person  Provider:   Minus Breeding, MD

## 2020-06-02 ENCOUNTER — Other Ambulatory Visit: Payer: Self-pay | Admitting: Family Medicine

## 2020-06-02 DIAGNOSIS — J4520 Mild intermittent asthma, uncomplicated: Secondary | ICD-10-CM

## 2020-06-02 NOTE — Telephone Encounter (Signed)
   Notes to clinic Historical Provider  

## 2020-06-13 ENCOUNTER — Other Ambulatory Visit: Payer: Self-pay | Admitting: Family Medicine

## 2020-06-13 DIAGNOSIS — I10 Essential (primary) hypertension: Secondary | ICD-10-CM

## 2020-06-14 ENCOUNTER — Other Ambulatory Visit: Payer: Self-pay | Admitting: Family Medicine

## 2020-06-14 MED ORDER — DILTIAZEM HCL ER 180 MG PO CP24: 180.0000 mg | ORAL_CAPSULE | Freq: Every day | ORAL | 0 refills | Status: DC

## 2020-06-14 NOTE — Telephone Encounter (Signed)
Pts wife called about refill for diltiazem (CARDIZEM CD) 180 MG 24 hr capsule  / please advise

## 2020-06-14 NOTE — Telephone Encounter (Signed)
I have called the pt and also spoke to the provider. Pt medication has been approved and we have sent in a 90 day supply to pharmacy on file. Pt is aware of this. We have also scheduled the pt for an f/u BP and med refill visit for 07/13/20 at 3:00pm.

## 2020-07-13 ENCOUNTER — Other Ambulatory Visit: Payer: Self-pay

## 2020-07-13 ENCOUNTER — Ambulatory Visit: Payer: Medicare PPO | Admitting: Family Medicine

## 2020-07-13 ENCOUNTER — Encounter: Payer: Self-pay | Admitting: Family Medicine

## 2020-07-13 VITALS — BP 140/80 | HR 70 | Temp 98.7°F | Ht 72.0 in | Wt 154.0 lb

## 2020-07-13 DIAGNOSIS — K224 Dyskinesia of esophagus: Secondary | ICD-10-CM

## 2020-07-13 DIAGNOSIS — I10 Essential (primary) hypertension: Secondary | ICD-10-CM

## 2020-07-13 DIAGNOSIS — K2289 Other specified disease of esophagus: Secondary | ICD-10-CM | POA: Diagnosis not present

## 2020-07-13 DIAGNOSIS — J4520 Mild intermittent asthma, uncomplicated: Secondary | ICD-10-CM

## 2020-07-13 MED ORDER — OMEPRAZOLE 20 MG PO CPDR: 20.0000 mg | DELAYED_RELEASE_CAPSULE | Freq: Two times a day (BID) | ORAL | 3 refills | Status: DC

## 2020-07-13 MED ORDER — ALBUTEROL SULFATE HFA 108 (90 BASE) MCG/ACT IN AERS: 1.0000 | INHALATION_SPRAY | RESPIRATORY_TRACT | 0 refills | Status: DC | PRN

## 2020-07-13 MED ORDER — DILTIAZEM HCL ER 180 MG PO CP24: 180.0000 mg | ORAL_CAPSULE | Freq: Every day | ORAL | 3 refills | Status: DC

## 2020-07-13 MED ORDER — FLOVENT HFA 44 MCG/ACT IN AERO: INHALATION_SPRAY | RESPIRATORY_TRACT | 6 refills | Status: DC

## 2020-07-13 NOTE — Patient Instructions (Addendum)
flovent inhaler can be used twice per day - 1 or 2 puffs twice per day.  Albuterol as a rescue inhaler if needed for asthma flare or wheezing. If that is needed more than once per week - return to discuss asthma treatment.   Medications refilled.  No dose changes at this time.  I will refer you to gastroenterology to discuss the previous esophagus issues.  Omeprazole refilled for now.  Return to the clinic or go to the nearest emergency room if any of your symptoms worsen or new symptoms occur.    If you have lab work done today you will be contacted with your lab results within the next 2 weeks.  If you have not heard from [place] then please contact us. The fastest way to get your results is to register for My Chart.   IF you received an x-ray today, you will receive an invoice from [facility]. Please contact [facility] at [phone] with questions or concerns regarding your invoice.   IF you received labwork today, you will receive an invoice from [place]. Please contact [facility] at [phone] with questions or concerns regarding your invoice.   Our billing staff will not be able to assist you with questions regarding bills from these companies.  You will be contacted with the lab results as soon as they are available. The fastest way to get your results is to activate your My Chart account. Instructions are located on the last page of this paperwork. If you have not heard from [place] regarding the results in 2 weeks, please contact this office.

## 2020-07-13 NOTE — Progress Notes (Signed)
Subjective:  Patient ID: Bradley Ramirez, male    DOB: 1940/10/16  Age: 79 y.o. MRN: [id number]  CC:  Chief Complaint  Patient presents with  . Follow-up    on hypertension. PT reports issues with his BP since last OV. pt reports his home readings have been normal. no physical symptoms.  . Medication Refill    pt reports he needs refills on all his current medication. pt reports his current medications work well with no known side effects.   Sign language interpreter present for visit.   HPI Bradley Ramirez presents for   Hypertension: With history of dilated cardiomyopathy, cardiologist Dr. Percival Spanish, and appointment August 20th noted.  Previous EF as low as 35%, EF 50% with mild aortic insufficiency in 2019.  Plan for possible echo prior to his visit next year.  Hypertension controlled, no changes. Current regimen of diltiazem 180 mg XR daily. Had been off meds for a few months, but now on meds since January.  Home readings: 134/70 today. 130-140 range.  No new side effects of meds.  BP Readings from Last 3 Encounters:  07/13/20 140/80  05/12/20 126/70  11/17/19 (!) 144/82   Lab Results  Component Value Date   CREATININE 1.74 (H) 09/17/2019  creatinine range 1.23-1.50  In 2016-2020. Slight increase in 08/2019.   Mild intermittent asthma: Treated with Flovent 44 mcg 2 puffs QAM.  Working well at current dose.  Does not have albuterol.    GERD with esophageal dysmotility, presbyesophagus: Discussed in February.  Continued on omeprazole 20 mg twice daily but recommended gastroenterology follow-up as potentially may need other imaging and testing. priro GI plan - " repeat barium study again with tablet at some point vs. Manometry if he is interesting in pursuing further workup for this issue and symptoms persist".   Thoroughly chewing food and small bites were discussed at that time,  Still taking omeprazole twice per day - controlling symptoms.   Wt Readings from Last 3  Encounters:  07/13/20 154 lb (69.9 kg)  05/12/20 150 lb 3.2 oz (68.1 kg)  11/17/19 152 lb (68.9 kg)   Handicap placard request.  Walking slow. Fatigue with long distance.  Some shortness of breath with walking fast only.   Had covid vaccine and booster.   History Patient Active Problem List   Diagnosis Date Noted  . 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 Educated about COVID-19 virus infection 05/11/2020  . Dilated cardiomyopathy ([place]) 05/11/2020  . Dysphasia 10/05/2019  . 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 Esophageal obstruction due to food impaction   . Esophageal stricture   . Constipation 11/24/2017  . Deaf 10/28/2013  . CARDIOMYOPATHY 04/20/2009  . HYPERTENSION, UNSPECIFIED 04/19/2009  . HIATAL HERNIA, HX OF 04/19/2009   Past Medical History:  Diagnosis Date  . Anxiety   . Aortic valve insufficiency   . Asthma   . Cardiomyopathy ([place])   . Chest pain    per pt/ was resolved/ was [place] per pt.  . Deafness    Pt does sign language!  . Diverticulosis   . Dysphagia, unspecified(787.20)   . Esophageal spasm   . Esophageal stricture   . GERD (gastroesophageal reflux disease)    no meds  . H. pylori infection   . Hiatal hernia   . Hypertension   . Internal hemorrhoids   . S/P colonoscopy    Past Surgical History:  Procedure Laterality Date  . BIOPSY  09/17/2019   Procedure: BIOPSY;  Surgeon: Jackquline Denmark, MD;  Location: [facility] ENDOSCOPY;  Service: Endoscopy;;  . COLONOSCOPY    . ESOPHAGEAL DILATION  09/17/2019   Procedure: ESOPHAGEAL MALONEY DILATION;  Surgeon: Jackquline Denmark, MD;  Location: [facility] ENDOSCOPY;  Service: Endoscopy;;  . ESOPHAGOGASTRODUODENOSCOPY (EGD) WITH PROPOFOL N/A 09/17/2019   Procedure: ESOPHAGOGASTRODUODENOSCOPY (EGD) WITH PROPOFOL;  Surgeon: Jackquline Denmark, MD;  Location: [facility] ENDOSCOPY;  Service: Endoscopy;  Laterality: N/A;  . TONSILLECTOMY     as a child  . UPPER GASTROINTESTINAL ENDOSCOPY     No Known Allergies Prior to Admission  medications   Medication Sig Start Date End Date Taking? Authorizing Provider  ALPRAZolam Duanne Moron) 0.5 MG tablet Take 0.5-1 tablets (0.25-0.5 mg total) by mouth daily as needed (Acute panic only.). 01/09/18  Yes Tereasa Coop, PA-C  diltiazem (DILACOR XR) 180 MG 24 hr capsule Take 1 capsule (180 mg total) by mouth daily. 06/14/20  Yes Wendie Agreste, MD  FLOVENT HFA 44 MCG/ACT inhaler TAKE 2 PUFFS BY MOUTH TWICE A DAY 06/02/20  Yes Wendie Agreste, MD  omeprazole (PRILOSEC) 20 MG capsule TAKE 1 CAPSULE (20 MG TOTAL) BY MOUTH 2 (TWO) TIMES DAILY BEFORE A MEAL. 02/09/20  Yes Wendie Agreste, MD   Social History   Socioeconomic History  . Marital status: Married    Spouse name: Not on file  . Number of children: 4  . Years of education: Not on file  . Highest education level: Some college, no degree  Occupational History  . Occupation: retired    Fish farm manager: [employer]  Tobacco Use  . Smoking status: Former Smoker    Types: Cigarettes    Quit date: 1987    Years since quitting: 34.8  . Smokeless tobacco: Never Used  Vaping Use  . Vaping Use: Never used  Substance and Sexual Activity  . Alcohol use: No    Comment: quit 1987  . Drug use: No  . Sexual activity: Not on file  Other Topics Concern  . 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 Difficulty of Paying Living Expenses: Not on file  Food Insecurity:   . Worried About Charity fundraiser in the Last Year: Not on file  . Ran Out of Food in the Last Year: Not on file  Transportation Needs:   . Lack of Transportation (Medical): Not on file  . Lack of Transportation (Non-Medical): Not on file  Physical Activity:   . Days of Exercise per Week: Not on file  . Minutes of Exercise per Session: Not on file  Stress:   . Feeling of Stress : Not on file  Social Connections:   . Frequency of Communication with  Friends and Family: Not on file  . Frequency of Social Gatherings with Friends and Family: Not on file  . Attends Religious Services: Not on file  . Active Member of Clubs or Organizations: Not on file  . Attends Archivist Meetings: Not on file  . Marital Status: Not on file  Intimate Partner Violence:   . Fear of Current or Ex-Partner: Not on file  . Emotionally Abused: Not on file  . Physically Abused: Not on file  . Sexually Abused: Not on file    Review of Systems  Constitutional: Negative for fatigue and unexpected weight change.  Eyes: Negative for visual disturbance.  Respiratory: Negative for cough, chest tightness and  shortness of breath.   Cardiovascular: Negative for chest pain, palpitations and leg swelling.  Gastrointestinal: Negative for abdominal pain and blood in stool.  Neurological: Negative for dizziness, light-headedness and headaches.     Objective:   Vitals:   07/13/20 1507 07/13/20 1511  BP: (!) 173/84 140/80  Pulse: 70   Temp: 98.7 F (37.1 C)   TempSrc: Temporal   SpO2: 92%   Weight: 154 lb (69.9 kg)   Height: 6' (1.829 m)      Physical Exam Vitals reviewed.  Constitutional:      Appearance: He is well-developed.  HENT:     Head: Normocephalic and atraumatic.  Eyes:     Pupils: Pupils are equal, round, and reactive to light.  Neck:     Vascular: No carotid bruit or JVD.  Cardiovascular:     Rate and Rhythm: Normal rate and regular rhythm.     Heart sounds: Normal heart sounds. No murmur heard.   Pulmonary:     Effort: Pulmonary effort is normal.     Breath sounds: Normal breath sounds. No rales.  Skin:    General: Skin is warm and dry.  Neurological:     Mental Status: He is alert and oriented to person, place, and time.     28 minutes spent during visit, greater than 50% counseling and assimilation of information, chart review, and discussion of plan.     Assessment & Plan:  Izell Labat is a 79 y.o. male  . Essential hypertension - Plan: diltiazem (DILACOR XR) 180 MG 24 hr capsule, Basic metabolic panel  -Stable on recheck, stable home readings.  Tolerating current regimen, continue same.  Slight elevation in creatinine last December, repeat labs.  Suspected CKD given prior range.  Mild intermittent asthma, uncomplicated - Plan: fluticasone (FLOVENT HFA) 44 MCG/ACT inhaler, albuterol (VENTOLIN HFA) 108 (90 Base) MCG/ACT inhaler  -Stable Flovent, dosing options discussed.  Albuterol provided if needed with RTC precautions  Presbyesophagus - Plan: omeprazole (PRILOSEC) 20 MG capsule, Ambulatory referral to Gastroenterology Esophageal dysmotility - Plan: omeprazole (PRILOSEC) 20 MG capsule, Ambulatory referral to Gastroenterology  -Refilled omeprazole at twice daily dosing but stressed importance of gastroenterology follow-up to decide on manometry versus repeat barium swallow.  Handicap placard completed as he has difficulty walking long distances without stopping to rest and a slower pace due to some dyspnea with walking fast.  No orders of the defined types were placed in this encounter.  Patient Instructions       If you have lab work done today you will be contacted with your lab results within the next 2 weeks.  If you have not heard from [place] then please contact us. The fastest way to get your results is to register for My Chart.   IF you received an x-ray today, you will receive an invoice from [facility]. Please contact [facility] at [phone] with questions or concerns regarding your invoice.   IF you received labwork today, you will receive an invoice from [facility]. Please contact [facility] at [phone] with questions or concerns regarding your invoice.   Our billing staff will not be able to assist you with questions regarding bills from these companies.  You will be contacted with the lab results as soon as they are available. The fastest way to get your  results is to activate your My Chart account. Instructions are located on the last page of this paperwork. If you have not heard from [place] regarding the results in 2  weeks, please contact this office.         Signed, [name], MD [facility]

## 2020-07-14 LAB — BASIC METABOLIC PANEL
BUN/Creatinine Ratio: 17 (ref 10–24)
BUN: 26 mg/dL (ref 8–27)
CO2: 25 mmol/L (ref 20–29)
Calcium: 9.4 mg/dL (ref 8.6–10.2)
Chloride: 103 mmol/L (ref 96–106)
Creatinine, Ser: 1.56 mg/dL — ABNORMAL HIGH (ref 0.76–1.27)
GFR calc Af Amer: 48 mL/min/{1.73_m2} — ABNORMAL LOW (ref >59)
GFR calc non Af Amer: 41 mL/min/{1.73_m2} — ABNORMAL LOW (ref >59)
Glucose: 84 mg/dL (ref 65–99)
Potassium: 4.1 mmol/L (ref 3.5–5.2)
Sodium: 141 mmol/L (ref 134–144)

## 2020-07-26 ENCOUNTER — Telehealth: Payer: Self-pay | Admitting: Family Medicine

## 2020-07-26 NOTE — Telephone Encounter (Signed)
Patient is calling back with  Request for most recent lab results /  [phone]

## 2020-07-26 NOTE — Telephone Encounter (Signed)
Pts wife  called back to let us know it  ok to leave a message at [phone]

## 2020-07-26 NOTE — Telephone Encounter (Signed)
Pt got the results.

## 2020-07-31 ENCOUNTER — Other Ambulatory Visit: Payer: Self-pay | Admitting: Family Medicine

## 2020-07-31 DIAGNOSIS — J4520 Mild intermittent asthma, uncomplicated: Secondary | ICD-10-CM

## 2020-07-31 NOTE — Telephone Encounter (Signed)
Requested Prescriptions  Pending Prescriptions Disp Refills   albuterol (VENTOLIN HFA) 108 (90 Base) MCG/ACT inhaler [Pharmacy Med Name: ALBUTEROL HFA (PROVENTIL) INH] 6.7 each     Sig: INHALE 1-2 PUFFS INTO THE LUNGS EVERY 4 HOURS AS NEEDED FOR WHEEZING OR SHORTNESS OF BREATH.     Pulmonology:  Beta Agonists Failed - 07/31/2020 12:06 AM      Failed - One inhaler should last at least one month. If the patient is requesting refills earlier, contact the patient to check for uncontrolled symptoms.      Passed - Valid encounter within last 12 months    Recent Outpatient Visits          2 weeks ago Essential hypertension   [facility], Ranell Patrick, MD   8 months ago Esophageal dysmotility   [facility], Ranell Patrick, MD   10 months ago Flank pain   [facility], Fenton Malling, MD   1 year ago Essential hypertension   [facility], Ranell Patrick, MD   1 year ago History of Helicobacter pylori infection   [facility], Ranell Patrick, MD      Future Appointments            In 5 months Carlota Raspberry Ranell Patrick, MD [facility] [place], [facility]

## 2020-09-10 ENCOUNTER — Other Ambulatory Visit: Payer: Self-pay | Admitting: Family Medicine

## 2020-09-10 DIAGNOSIS — J4520 Mild intermittent asthma, uncomplicated: Secondary | ICD-10-CM

## 2020-10-17 DIAGNOSIS — I10 Essential (primary) hypertension: Secondary | ICD-10-CM | POA: Diagnosis not present

## 2020-10-17 DIAGNOSIS — K08109 Complete loss of teeth, unspecified cause, unspecified class: Secondary | ICD-10-CM | POA: Diagnosis not present

## 2020-10-17 DIAGNOSIS — K219 Gastro-esophageal reflux disease without esophagitis: Secondary | ICD-10-CM | POA: Diagnosis not present

## 2020-10-17 DIAGNOSIS — Z7951 Long term (current) use of inhaled steroids: Secondary | ICD-10-CM | POA: Diagnosis not present

## 2020-10-17 DIAGNOSIS — Z87891 Personal history of nicotine dependence: Secondary | ICD-10-CM | POA: Diagnosis not present

## 2020-10-17 DIAGNOSIS — I429 Cardiomyopathy, unspecified: Secondary | ICD-10-CM | POA: Diagnosis not present

## 2020-10-17 DIAGNOSIS — J449 Chronic obstructive pulmonary disease, unspecified: Secondary | ICD-10-CM | POA: Diagnosis not present

## 2021-01-11 ENCOUNTER — Ambulatory Visit: Payer: Self-pay | Admitting: Family Medicine

## 2021-01-17 ENCOUNTER — Other Ambulatory Visit: Payer: Self-pay

## 2021-01-17 ENCOUNTER — Encounter: Payer: Self-pay | Admitting: Family Medicine

## 2021-01-17 ENCOUNTER — Ambulatory Visit: Payer: Medicare PPO | Admitting: Family Medicine

## 2021-01-17 VITALS — BP 132/76 | HR 71 | Temp 98.3°F | Resp 17 | Ht 72.0 in | Wt 156.2 lb

## 2021-01-17 DIAGNOSIS — K2970 Gastritis, unspecified, without bleeding: Secondary | ICD-10-CM

## 2021-01-17 DIAGNOSIS — J4520 Mild intermittent asthma, uncomplicated: Secondary | ICD-10-CM

## 2021-01-17 DIAGNOSIS — I10 Essential (primary) hypertension: Secondary | ICD-10-CM

## 2021-01-17 DIAGNOSIS — K224 Dyskinesia of esophagus: Secondary | ICD-10-CM

## 2021-01-17 DIAGNOSIS — R7989 Other specified abnormal findings of blood chemistry: Secondary | ICD-10-CM | POA: Diagnosis not present

## 2021-01-17 DIAGNOSIS — K2289 Other specified disease of esophagus: Secondary | ICD-10-CM | POA: Diagnosis not present

## 2021-01-17 MED ORDER — FLOVENT HFA 44 MCG/ACT IN AERO: INHALATION_SPRAY | RESPIRATORY_TRACT | 6 refills | Status: DC

## 2021-01-17 NOTE — Patient Instructions (Addendum)
Cardiology follow up in August - echo ordered prior to that visit.  Stop alleve - it can hurt your stomach and kidneys. Tylenol is ok. If one area remains sore - schedule visit and we can look at that area if needed.  I am rechecking labs.  Okay to continue omeprazole twice per day for now but I will refer you back to gastroenterology to decide if other testing needed.  Thank you for coming in today, follow-up in 6 months but let me know if there are questions sooner

## 2021-01-17 NOTE — Progress Notes (Signed)
Subjective:  Patient ID: Bradley Ramirez, male    DOB: [birth year]  Age: 80 y.o. MRN: [id number]  CC:  Chief Complaint  Patient presents with  . Hypertension    Pt following up. Pt denies physical symptoms   . Follow-up    Pt needs kidney function tesing per labs from October. Pt agreeable no concerns    Presents with sign language interpreter  HPI Bradley Ramirez presents for   Hypertension: With history of dilated cardiomyopathy, cardiologist Dr. Percival Spanish.  History of EF at 35%, EF 50% with mild aortic insufficiency in 2019.  Treated with diltiazem 180 mg XR daily.  Elevated creatinine noted in October.  Avoidance of NSAIDs and hydration discussed.  Creatinine range of 1.31-1.74 over past few years.  Most recently 1.56 in October. He is taking alleve daily for sore joints.  appt with cardiology in August.  Still active, walking at [employer].  Home readings: BP Readings from Last 3 Encounters:  01/17/21 132/76  07/13/20 140/80  05/12/20 126/70   Lab Results  Component Value Date   CREATININE 1.56 (H) 07/13/2020   Mild intermittent asthma Flovent 44 mcg 2 puffs every morning, albuterol prescribed if needed last visit in October. Still taking flovent daily,. working well - no need for albuterol.    GERD with esophageal dysmotility, presbyesophagus GERD symptoms controlled with omeprazole twice daily at last visit in October. Recommended gastroenterology follow-up to decide on manometry versus repeat barium swallow. Has not followed up with GI. Still taking PPI BID. Controlling symptoms.   No new side effects with meds.    History Patient Active Problem List   Diagnosis Date Noted  . 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 Educated about COVID-19 virus infection 05/11/2020  . Dilated cardiomyopathy ([place]) 05/11/2020  . Dysphasia 10/05/2019  . 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 Esophageal obstruction due to food impaction   . Esophageal stricture    . Constipation 11/24/2017  . Deaf 10/28/2013  . CARDIOMYOPATHY 04/20/2009  . HYPERTENSION, UNSPECIFIED 04/19/2009  . HIATAL HERNIA, HX OF 04/19/2009   Past Medical History:  Diagnosis Date  . Anxiety   . Aortic valve insufficiency   . Asthma   . Cardiomyopathy ([place])   . Chest pain    per pt/ was resolved/ was [place] per pt.  . Deafness    Pt does sign language!  . Diverticulosis   . Dysphagia, unspecified(787.20)   . Esophageal spasm   . Esophageal stricture   . GERD (gastroesophageal reflux disease)    no meds  . H. pylori infection   . Hiatal hernia   . Hypertension   . Internal hemorrhoids   . S/P colonoscopy    Past Surgical History:  Procedure Laterality Date  . BIOPSY  09/17/2019   Procedure: BIOPSY;  Surgeon: Jackquline Denmark, MD;  Location[facility] ENDOSCOPY;  Service: Endoscopy;;  . COLONOSCOPY    . ESOPHAGEAL DILATION  09/17/2019   Procedure: ESOPHAGEAL MALONEY DILATION;  Surgeon: Jackquline Denmark, MD;  Location: [facility] ENDOSCOPY;  Service: Endoscopy;;  . ESOPHAGOGASTRODUODENOSCOPY (EGD) WITH PROPOFOL N/A 09/17/2019   Procedure: ESOPHAGOGASTRODUODENOSCOPY (EGD) WITH PROPOFOL;  Surgeon: Jackquline Denmark, MD;  Location: [facility] ENDOSCOPY;  Service: Endoscopy;  Laterality: N/A;  . TONSILLECTOMY     as a child  . UPPER GASTROINTESTINAL ENDOSCOPY     No Known Allergies Prior to Admission medications   Medication Sig Start Date End Date Taking? Authorizing Provider  albuterol (VENTOLIN HFA) 108 (90 Base) MCG/ACT inhaler INHALE 1-2 PUFFS INTO THE LUNGS  EVERY 4 HOURS AS NEEDED FOR WHEEZING OR SHORTNESS OF BREATH. 09/10/20  Yes Wendie Agreste, MD  ALPRAZolam Duanne Moron) 0.5 MG tablet Take 0.5-1 tablets (0.25-0.5 mg total) by mouth daily as needed (Acute panic only.). 01/09/18  Yes Tereasa Coop, PA-C  diltiazem (DILACOR XR) 180 MG 24 hr capsule Take 1 capsule (180 mg total) by mouth daily. 07/13/20  Yes Wendie Agreste, MD  fluticasone (FLOVENT HFA) 44 MCG/ACT inhaler TAKE 2 PUFFS BY  MOUTH TWICE A DAY 07/13/20  Yes Wendie Agreste, MD  omeprazole (PRILOSEC) 20 MG capsule Take 1 capsule (20 mg total) by mouth 2 (two) times daily before a meal. 07/13/20  Yes Wendie Agreste, MD   Social History   Socioeconomic History  . Marital status: Married    Spouse name: Not on file  . Number of children: 4  . Years of education: Not on file  . Highest education level: Some college, no degree  Occupational History  . Occupation: retired    Fish farm manager: [employer]  Tobacco Use  . Smoking status: Former Smoker    Types: Cigarettes    Quit date: 1987    Years since quitting: 35.3  . Smokeless tobacco: Never Used  Vaping Use  . Vaping Use: Never used  Substance and Sexual Activity  . Alcohol use: No    Comment: quit 1987  . Drug use: No  . Sexual activity: Not on file  Other Topics Concern  . 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  Eyes: Negative for visual disturbance.  Respiratory: Negative for cough, chest tightness, shortness of breath and wheezing.   Cardiovascular: Negative for chest pain, palpitations and leg swelling.  Gastrointestinal: Negative for abdominal pain and blood in stool.  Neurological: Negative for dizziness, light-headedness and headaches.     Objective:   Vitals:   01/17/21 1427  BP: 132/76  Pulse: 71  Resp: 17  Temp: 98.3 F (36.8 C)  TempSrc: Temporal  SpO2: 97%  Weight: 156 lb 3.2 oz (70.9 kg)  Height: 6' (1.829 m)     Physical Exam Vitals reviewed.  Constitutional:      Appearance: He is well-developed.  HENT:      Head: Normocephalic and atraumatic.  Eyes:     Pupils: Pupils are equal, round, and reactive to light.  Neck:     Vascular: No carotid bruit or JVD.  Cardiovascular:     Rate and Rhythm: Normal rate and regular rhythm.     Heart sounds: Normal heart sounds. No murmur heard.   Pulmonary:     Effort: Pulmonary effort is normal.     Breath sounds: Normal breath sounds. No rales.  Abdominal:     General: There is no distension.     Tenderness: There is no abdominal tenderness.  Skin:    General: Skin is warm and dry.  Neurological:     Mental Status: He is alert and oriented to person, place, and time.     30 minutes spent during visit, greater than 50%  counseling and assimilation of information, chart review, and discussion of plan.     Assessment & Plan:  Bradley Ramirez is a 80 y.o. male . Mild intermittent asthma, uncomplicated  -Stable, continue Flovent.  Has albuterol as needed  Presbyesophagus - Plan: Ambulatory referral to Gastroenterology Esophageal dysmotility - Plan: Ambulatory referral to Gastroenterology Gastritis without bleeding, unspecified chronicity, unspecified gastritis type - Plan: Ambulatory referral to Gastroenterology  -Okay to continue twice daily PPI as symptoms control for now but will refer to gastroenterology to decide if manometry other testing needed, as requiring twice daily dosing for control.  Essential hypertension - Plan: Basic metabolic panel Elevated serum creatinine - Plan: Basic metabolic panel  -Hypertension stable.  Avoidance of NSAIDs discussed, stop Aleve.  Tylenol is okay.  Various formulations discussed if difficulty swallowing pills.  Check BMP.  28-month follow-up.  No orders of the defined types were placed in this encounter.  Patient Instructions  Cardiology follow up in August - echo ordered prior to that visit.  Stop alleve - it can hurt your stomach and kidneys. Tylenol is ok. If one area remains sore - schedule visit and  we can look at that area if needed.  I am rechecking labs.  Okay to continue omeprazole twice per day for now but I will refer you back to gastroenterology to decide if other testing needed.  Thank you for coming in today, follow-up in 6 months but let me know if there are questions sooner       Signed, [name], MD [facility]

## 2021-01-18 LAB — BASIC METABOLIC PANEL
BUN: 27 mg/dL — ABNORMAL HIGH (ref 6–23)
CO2: 29 mEq/L (ref 19–32)
Calcium: 9.3 mg/dL (ref 8.4–10.5)
Chloride: 103 mEq/L (ref 96–112)
Creatinine, Ser: 1.65 mg/dL — ABNORMAL HIGH (ref 0.40–1.50)
GFR: 38.82 mL/min — ABNORMAL LOW (ref >60.00)
Glucose, Bld: 82 mg/dL (ref 70–99)
Potassium: 4.2 mEq/L (ref 3.5–5.1)
Sodium: 138 mEq/L (ref 135–145)

## 2021-03-14 ENCOUNTER — Ambulatory Visit: Payer: Medicare PPO | Admitting: Gastroenterology

## 2021-03-14 ENCOUNTER — Encounter: Payer: Self-pay | Admitting: Gastroenterology

## 2021-03-14 VITALS — BP 130/70 | HR 75 | Ht 74.0 in | Wt 154.2 lb

## 2021-03-14 DIAGNOSIS — R4702 Dysphasia: Secondary | ICD-10-CM

## 2021-03-14 DIAGNOSIS — K2289 Other specified disease of esophagus: Secondary | ICD-10-CM | POA: Diagnosis not present

## 2021-03-14 NOTE — Progress Notes (Signed)
Agree with assessment and plan as outlined. Sounds like he may have a motility disorder however doing well on PPI without symptoms, would monitor for now. He can follow up with [place] as needed. Thanks

## 2021-03-14 NOTE — Progress Notes (Signed)
03/14/2021 Bradley Ramirez [id number] 1941-05-26   HISTORY OF PRESENT ILLNESS:  This is a pleasant 81 year-old male who is a patient of Dr. Doyne Keel.  He is here today for follow-up of his dysphagia and presbyesophagus.  He was last seen by me in January 2021.  The patient presented to the ER on Christmas day 2020 with what they thought was a food impaction after eating pork chop.  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  Found to have mild gastritis as well.  Biopsies were all normal, negative for H. pylori and negative for eosinophilic esophagitis.  When I saw him in January 2021 he was doing well.  He says that he is still doing well.  He is currently on omeprazole 20 mg daily.  He says that he has not had any major issues with swallowing recently.  He has been taking precautions when eating, chewing well, eating slowly, etc.   He has had issues with dysphagia in the past with dilation as well.  Had an esophageal manometry in 2010 that showed incomplete relaxation of the LES.  Previous barium esophagram showed esophageal dysmotility as well.   Past Medical History:  Diagnosis Date   Anxiety    Aortic valve insufficiency    Asthma    Cardiomyopathy ([place])    Chest pain    per pt/ was resolved/ was [place] per pt.   Deafness    Pt does sign language!   Diverticulosis    Dysphagia, unspecified(787.20)    Esophageal spasm    Esophageal stricture    GERD (gastroesophageal reflux disease)    no meds   H. pylori infection    Hiatal hernia    Hypertension    Internal hemorrhoids    S/P colonoscopy    Past Surgical History:  Procedure Laterality Date   BIOPSY  09/17/2019   Procedure: BIOPSY;  Surgeon: Jackquline Denmark, MD;  Location: [facility] ENDOSCOPY;  Service: Endoscopy;;   COLONOSCOPY     ESOPHAGEAL DILATION  09/17/2019   Procedure: ESOPHAGEAL MALONEY DILATION;  Surgeon: Jackquline Denmark, MD;  Location: [facility] ENDOSCOPY;  Service: Endoscopy;;    ESOPHAGOGASTRODUODENOSCOPY (EGD) WITH PROPOFOL N/A 09/17/2019   Procedure: ESOPHAGOGASTRODUODENOSCOPY (EGD) WITH PROPOFOL;  Surgeon: Jackquline Denmark, MD;  Location: [facility] ENDOSCOPY;  Service: Endoscopy;  Laterality: N/A;   TONSILLECTOMY     as a child   UPPER GASTROINTESTINAL ENDOSCOPY      reports that he quit smoking about 35 years ago. His smoking use included cigarettes. He has never used smokeless tobacco. He reports that he does not drink alcohol and does not use drugs. family history includes Diabetes in his maternal grandfather, maternal grandmother, and maternal uncle; Uterine cancer in his maternal aunt and mother. No Known Allergies    Outpatient Encounter Medications as of 03/14/2021  Medication Sig   diltiazem (DILACOR XR) 180 MG 24 hr capsule Take 1 capsule (180 mg total) by mouth daily.   fluticasone (FLOVENT HFA) 44 MCG/ACT inhaler TAKE 2 PUFFS BY MOUTH TWICE A DAY   omeprazole (PRILOSEC) 20 MG capsule Take 1 capsule (20 mg total) by mouth 2 (two) times daily before a meal.   [DISCONTINUED] albuterol (VENTOLIN HFA) 108 (90 Base) MCG/ACT inhaler INHALE 1-2 PUFFS INTO THE LUNGS EVERY 4 HOURS AS NEEDED FOR WHEEZING OR SHORTNESS OF BREATH.   [DISCONTINUED] ALPRAZolam (XANAX) 0.5 MG tablet Take 0.5-1 tablets (0.25-0.5 mg total) by mouth daily as needed (Acute panic only.).  No facility-administered encounter medications on file as of 03/14/2021.     REVIEW OF SYSTEMS  : All other systems reviewed and negative except where noted in the History of Present Illness.   PHYSICAL EXAM: BP 130/70   Pulse 75   Ht 6\' 2"  (1.88 m)   Wt 154 lb 3.2 oz (69.9 kg)   BMI 19.80 kg/m  General: Well developed AA male in no acute distress Head: Normocephalic and atraumatic Eyes:  Sclerae anicteric, conjunctiva pink. Ears: Hearing impaired. Lungs: Clear throughout to auscultation; no W/R/R. Heart: Regular rate and rhythm; no M/R/G. Abdomen: Soft, non-distended.  BS present.  Non-tender. Musculoskeletal: Symmetrical with no gross deformities  Skin: No lesions on visible extremities Extremities: No edema  Neurological: Alert oriented x 4, grossly non-focal Psychological:  Alert and cooperative. Normal mood and affect  ASSESSMENT AND PLAN: *Dysphagia: Has presbyesophagus as seen on EGD 08/2019, esophagus dilated.  Esophageal manometry in the past (2010) demonstrated incomplete relaxation of the LES and previous barium study showing esophageal dysmotility.  Currently having no issues on omeprazole 20 mg daily.  He will continue the PPI and needs to continue to take precautions with eating.  He understands.  He will call back with any further issues.  If continues to have problems may want to consider repeating manometry study.   **Entire visit today was performed via sign language interpreter service as the patient is hearing impaired.   CC:  Wendie Agreste, MD

## 2021-03-14 NOTE — Patient Instructions (Signed)
Follow up as needed.  If you are age 81 or older, your body mass index should be between 23-30. Your Body mass index is 19.8 kg/m. If this is out of the aforementioned range listed, please consider follow up with your Primary Care Provider.  If you are age 42 or younger, your body mass index should be between 19-25. Your Body mass index is 19.8 kg/m. If this is out of the aformentioned range listed, please consider follow up with your Primary Care Provider.   __________________________________________________________  The [facility] providers would like to encourage you to use [facility] to communicate with providers for non-urgent requests or questions.  Due to long hold times on the telephone, sending your provider a message by [facility] may be a faster and more efficient way to get a response.  Please allow 48 business hours for a response.  Please remember that this is for non-urgent requests.

## 2021-05-10 ENCOUNTER — Ambulatory Visit (HOSPITAL_COMMUNITY): Payer: Medicare PPO | Attending: Cardiovascular Disease

## 2021-05-10 ENCOUNTER — Other Ambulatory Visit: Payer: Self-pay

## 2021-05-10 DIAGNOSIS — I351 Nonrheumatic aortic (valve) insufficiency: Secondary | ICD-10-CM | POA: Diagnosis not present

## 2021-05-10 LAB — ECHOCARDIOGRAM COMPLETE
Area-P 1/2: 1.99 cm2
S' Lateral: 3 cm

## 2021-05-14 ENCOUNTER — Encounter: Payer: Self-pay | Admitting: *Deleted

## 2021-07-19 ENCOUNTER — Ambulatory Visit: Payer: Medicare PPO | Admitting: Family Medicine

## 2021-08-03 ENCOUNTER — Other Ambulatory Visit: Payer: Self-pay | Admitting: Family Medicine

## 2021-08-03 DIAGNOSIS — I10 Essential (primary) hypertension: Secondary | ICD-10-CM

## 2021-08-29 ENCOUNTER — Ambulatory Visit: Payer: Medicare PPO | Admitting: Family Medicine

## 2021-09-20 ENCOUNTER — Encounter: Payer: Self-pay | Admitting: Family Medicine

## 2021-09-20 ENCOUNTER — Ambulatory Visit: Payer: Medicare PPO | Admitting: Family Medicine

## 2021-09-20 VITALS — BP 134/72 | HR 65 | Temp 98.1°F | Resp 17 | Ht 74.0 in | Wt 153.0 lb

## 2021-09-20 DIAGNOSIS — N1832 Chronic kidney disease, stage 3b: Secondary | ICD-10-CM | POA: Diagnosis not present

## 2021-09-20 DIAGNOSIS — K2289 Other specified disease of esophagus: Secondary | ICD-10-CM | POA: Diagnosis not present

## 2021-09-20 DIAGNOSIS — K224 Dyskinesia of esophagus: Secondary | ICD-10-CM | POA: Diagnosis not present

## 2021-09-20 DIAGNOSIS — I10 Essential (primary) hypertension: Secondary | ICD-10-CM

## 2021-09-20 DIAGNOSIS — J4520 Mild intermittent asthma, uncomplicated: Secondary | ICD-10-CM

## 2021-09-20 MED ORDER — FLUTICASONE PROPIONATE HFA 44 MCG/ACT IN AERO: INHALATION_SPRAY | RESPIRATORY_TRACT | 6 refills | Status: DC

## 2021-09-20 MED ORDER — OMEPRAZOLE 20 MG PO CPDR: 20.0000 mg | DELAYED_RELEASE_CAPSULE | Freq: Two times a day (BID) | ORAL | 3 refills | Status: DC

## 2021-09-20 NOTE — Progress Notes (Signed)
Subjective:  Patient ID: Bradley Ramirez, male    DOB: 10/07/1940  Age: 80 y.o. MRN: [id number]  CC:  Chief Complaint  Patient presents with   Hypertension    Pt here for recheck today, reports no concerns today    Gastroesophageal Reflux    Pt reports medication has helped greatly with his reflux no concerns     HPI [name] presents for  Follow-up of chronic conditions, presents with sign language interpreter.  Hypertension: With history of dilated cardiomyopathy, cardiologist Dr. Percival Spanish.  Prior history of EF down to 35%, 50% with mild aortic insufficiency in 2019.  Diltiazem extended release 180 mg daily.  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  Unfortunately he was still taking Aleve daily for sore joints at his last visit in April.  Avoidance of NSAIDs discussed again with Tylenol if needed. Has remained active, walking at [employer] for exercise.  No new side effects with meds. No alleve  only tylenol.  Home readings: rarely - stable with in office readings.  BP Readings from Last 3 Encounters:  09/20/21 134/72  03/14/21 130/70  01/17/21 132/76   Lab Results  Component Value Date   CREATININE 1.65 (H) 01/17/2021   Esophageal dysmotility, presbyesophagus, gastritis See prior notes.  Referred back to gastroenterology in April to decide on manometry or other testing needed as still requiring twice daily dosing of PPI for control of symptoms.  Visit June 22 noted.  As he was doing well on PPI without symptoms we will plan for continued monitoring although may have a motility disorder.  As needed follow-up discussed.  Dr. Havery Moros, [facility], [facility].   Still doing ok - no dysphagia, emesis, or heartburn abdominal pain.   Mild intermittent asthma Flovent 44 mcg 2 puffs every morning.  Albuterol if needed but has been controlled with just  taking Flovent. No new cough/dyspnea. No albuterol need.   HM: Flu vaccine at [facility].  Covid booster: 02/27/21, and 08/24/21 - bivalent vaccine.   History Patient Active Problem List   Diagnosis Date Noted   Nonrheumatic aortic valve insufficiency 05/11/2020   Educated about COVID-19 virus infection 05/11/2020   Dilated cardiomyopathy ([place]) 05/11/2020   Dysphasia 10/05/2019   Presbyesophagus 10/05/2019   Esophageal obstruction due to food impaction    Esophageal stricture    Constipation 11/24/2017   Deaf 10/28/2013   CARDIOMYOPATHY 04/20/2009   HYPERTENSION, UNSPECIFIED 04/19/2009   HIATAL HERNIA, HX OF 04/19/2009   Past Medical History:  Diagnosis Date   Anxiety    Aortic valve insufficiency    Asthma    Cardiomyopathy ([place])    Chest pain    per pt/ was resolved/ was [place] per pt.   Deafness    Pt does sign language!   Diverticulosis    Dysphagia, unspecified(787.20)    Esophageal spasm    Esophageal stricture    GERD (gastroesophageal reflux disease)    no meds   H. pylori infection    Hiatal hernia    Hypertension    Internal hemorrhoids    S/P colonoscopy    Past Surgical History:  Procedure Laterality Date   BIOPSY  09/17/2019   Procedure: BIOPSY;  Surgeon: Jackquline Denmark, MD;  Location: [facility] ENDOSCOPY;  Service: Endoscopy;;   COLONOSCOPY     ESOPHAGEAL DILATION  09/17/2019   Procedure: ESOPHAGEAL MALONEY DILATION;  Surgeon: Jackquline Denmark, MD;  Location: [facility] ENDOSCOPY;  Service: Endoscopy;;   ESOPHAGOGASTRODUODENOSCOPY (EGD) WITH PROPOFOL N/A 09/17/2019   Procedure: ESOPHAGOGASTRODUODENOSCOPY (EGD) WITH PROPOFOL;  Surgeon: Jackquline Denmark, MD;  Location: [facility] ENDOSCOPY;  Service: Endoscopy;  Laterality: N/A;   TONSILLECTOMY     as a child   UPPER GASTROINTESTINAL ENDOSCOPY     No Known Allergies Prior to Admission medications   Medication Sig Start Date End Date Taking? Authorizing Provider  DILT-XR 180 MG 24 hr capsule TAKE 1 CAPSULE BY MOUTH EVERY DAY 08/03/21   Yes Wendie Agreste, MD  fluticasone (FLOVENT HFA) 44 MCG/ACT inhaler TAKE 2 PUFFS BY MOUTH TWICE A DAY 01/17/21  Yes Wendie Agreste, MD  omeprazole (PRILOSEC) 20 MG capsule Take 1 capsule (20 mg total) by mouth 2 (two) times daily before a meal. 07/13/20  Yes Wendie Agreste, MD   Social History   Socioeconomic History   Marital status: Married    Spouse name: Not on file   Number of children: 4   Years of education: Not on file   Highest education level: Some college, no degree  Occupational History   Occupation: retired    Fish farm manager: [employer]  Tobacco Use   Smoking status: Former    Types: Cigarettes    Quit date: 1987    Years since quitting: 36.0   Smokeless tobacco: Never  Vaping Use   Vaping Use: Never used  Substance and Sexual Activity   Alcohol use: No    Comment: quit 1987   Drug use: No   Sexual activity: Not on file  Other Topics Concern   Not on file  Social History Narrative   Married   Works as custodian   Education:  High school   Social Determinants of Radio broadcast assistant Strain: Not on Art therapist Insecurity: Not on file  Transportation Needs: Not on file  Physical Activity: Not on file  Stress: Not on file  Social Connections: Not on file  Intimate Partner Violence: Not on file    Review of Systems  Constitutional:  Negative for fatigue and unexpected weight change.  Eyes:  Negative for visual disturbance.  Respiratory:  Negative for cough, chest tightness and shortness of breath.   Cardiovascular:  Negative for chest pain, palpitations and leg swelling.  Gastrointestinal:  Negative for abdominal pain and blood in stool.  Neurological:  Negative for dizziness, light-headedness and headaches.    Objective:   Vitals:   09/20/21 1458  BP: 134/72  Pulse: 65  Resp: 17  Temp: 98.1 F (36.7 C)  TempSrc: Temporal  SpO2: 97%  Weight: 153 lb (69.4 kg)  Height: 6\' 2"  (1.88 m)     Physical Exam Vitals reviewed.  Constitutional:       Appearance: He is well-developed.  HENT:     Head: Normocephalic and atraumatic.  Neck:     Vascular: No carotid bruit or JVD.  Cardiovascular:     Rate and Rhythm: Normal rate and regular rhythm.     Heart sounds: Normal heart sounds. No murmur heard. Pulmonary:     Effort: Pulmonary effort is normal.     Breath sounds: Normal breath sounds. No rales.  Musculoskeletal:     Right lower leg: No edema.     Left lower leg: No edema.  Skin:    General: Skin is warm and dry.  Neurological:     Mental Status: He is alert and oriented to person, place, and time.  Psychiatric:  Mood and Affect: Mood normal.       Assessment & Plan:  Gotham Raden is a 80 y.o. male . Stage 3b chronic kidney disease ([place]) - Plan: Basic metabolic panel  -Commended on NSAID avoidance, continue Tylenol as needed, check BMP, maintain hydration.  Presbyesophagus - Plan: omeprazole (PRILOSEC) 20 MG capsule Esophageal dysmotility - Plan: omeprazole (PRILOSEC) 20 MG capsule  -Stable symptoms without new symptoms on twice daily PPI, continue same based on information from gastroenterology with as needed follow-up with GI.  Essential hypertension - Plan: Basic metabolic panel  -Stable control, continue same dose of diltiazem.  Has refills.  Euvolemic, no signs of failure.  Mild intermittent asthma, uncomplicated - Plan: fluticasone (FLOVENT HFA) 44 MCG/ACT inhaler Stable with Flovent, has albuterol if needed for breakthrough symptoms with RTC precautions.  Understand expressed of plan with use of sign language interpreter.  All questions answered  Meds ordered this encounter  Medications   omeprazole (PRILOSEC) 20 MG capsule    Sig: Take 1 capsule (20 mg total) by mouth 2 (two) times daily before a meal.    Dispense:  180 capsule    Refill:  3   fluticasone (FLOVENT HFA) 44 MCG/ACT inhaler    Sig: TAKE 2 PUFFS BY MOUTH TWICE A DAY    Dispense:  31.8 each    Refill:  6   There are no  Patient Instructions on file for this visit.    Signed,   [name], MD [facility] 09/20/21 3:33 PM

## 2021-09-21 LAB — BASIC METABOLIC PANEL
BUN: 31 mg/dL — ABNORMAL HIGH (ref 6–23)
CO2: 27 mEq/L (ref 19–32)
Calcium: 9.1 mg/dL (ref 8.4–10.5)
Chloride: 106 mEq/L (ref 96–112)
Creatinine, Ser: 1.43 mg/dL (ref 0.40–1.50)
GFR: 45.87 mL/min — ABNORMAL LOW (ref >60.00)
Glucose, Bld: 82 mg/dL (ref 70–99)
Potassium: 4.3 mEq/L (ref 3.5–5.1)
Sodium: 139 mEq/L (ref 135–145)

## 2022-03-11 ENCOUNTER — Ambulatory Visit: Payer: Medicare PPO | Admitting: Family Medicine

## 2022-03-11 ENCOUNTER — Encounter: Payer: Self-pay | Admitting: Family Medicine

## 2022-03-11 VITALS — BP 126/78 | HR 71 | Temp 98.3°F | Resp 16 | Ht 74.0 in | Wt 149.0 lb

## 2022-03-11 DIAGNOSIS — N1832 Chronic kidney disease, stage 3b: Secondary | ICD-10-CM | POA: Diagnosis not present

## 2022-03-11 DIAGNOSIS — M79642 Pain in left hand: Secondary | ICD-10-CM

## 2022-03-11 DIAGNOSIS — M79641 Pain in right hand: Secondary | ICD-10-CM | POA: Diagnosis not present

## 2022-03-11 DIAGNOSIS — K224 Dyskinesia of esophagus: Secondary | ICD-10-CM | POA: Diagnosis not present

## 2022-03-11 DIAGNOSIS — K2289 Other specified disease of esophagus: Secondary | ICD-10-CM | POA: Diagnosis not present

## 2022-03-11 DIAGNOSIS — I10 Essential (primary) hypertension: Secondary | ICD-10-CM

## 2022-03-11 DIAGNOSIS — J4520 Mild intermittent asthma, uncomplicated: Secondary | ICD-10-CM

## 2022-03-11 LAB — BASIC METABOLIC PANEL
BUN: 25 mg/dL — ABNORMAL HIGH (ref 6–23)
CO2: 27 mEq/L (ref 19–32)
Calcium: 9.1 mg/dL (ref 8.4–10.5)
Chloride: 107 mEq/L (ref 96–112)
Creatinine, Ser: 1.47 mg/dL (ref 0.40–1.50)
GFR: 44.23 mL/min — ABNORMAL LOW (ref >60.00)
Glucose, Bld: 87 mg/dL (ref 70–99)
Potassium: 4.2 mEq/L (ref 3.5–5.1)
Sodium: 140 mEq/L (ref 135–145)

## 2022-03-11 MED ORDER — DILTIAZEM HCL ER 180 MG PO CP24: ORAL_CAPSULE | ORAL | 3 refills | Status: DC

## 2022-03-11 MED ORDER — FLUTICASONE PROPIONATE HFA 44 MCG/ACT IN AERO: INHALATION_SPRAY | RESPIRATORY_TRACT | 6 refills | Status: DC

## 2022-03-11 NOTE — Patient Instructions (Signed)
Pain in hands could be a trigger finger or arthritis of joints.  Okay to continue Tylenol, Voltaren cream if needed but do not take any oral NSAIDs like Advil or Aleve by mouth.  No other medication changes today.  Let me know if the hand pain is worsening or when you would like to meet with hand specialist and I will place that referral.  Follow-up in 6 months.  Take care!  Trigger Finger  Trigger finger, also called stenosing tenosynovitis,  is a condition that causes a finger to get stuck in a bent position. Each finger has a tendon, which is a tough, cord-like tissue that connects muscle to bone, and each tendon passes through a tunnel of tissue called a tendon sheath. To move your finger, your tendon needs to glide freely through the sheath. Trigger finger happens when the tendon or the sheath thickens, making it difficult to move your finger. Trigger finger can affect any finger or a thumb. It may affect more than one finger. Mild cases may clear up with rest and medicine. Severe cases require more treatment. What are the causes? Trigger finger is caused by a thickened finger tendon or tendon sheath. The cause of this thickening is not known. What increases the risk? The following factors may make you more likely to develop this condition: Doing activities that require a strong grip. Having rheumatoid arthritis, gout, or diabetes. Being 5-71 years old. Being male. What are the signs or symptoms? Symptoms of this condition include: Pain when bending or straightening your finger. Tenderness or swelling where your finger attaches to the palm of your hand. A lump in the palm of your hand or on the inside of your finger. Hearing a noise like a pop or a snap when you try to straighten your finger. Feeling a catching or locking sensation when you try to straighten your finger. Being unable to straighten your finger. How is this diagnosed? This condition is diagnosed based on your symptoms  and a physical exam. How is this treated? This condition may be treated by: Resting your finger and avoiding activities that make symptoms worse. Wearing a finger splint to keep your finger extended. Taking NSAIDs, such as ibuprofen, to relieve pain and swelling. Doing gentle exercises to stretch the finger as told by your health care provider. Having medicine that reduces swelling and inflammation (steroids) injected into the tendon sheath. Injections may need to be repeated. Having surgery to open the tendon sheath. This may be done if other treatments do not work and you cannot straighten your finger. You may need physical therapy after surgery. Follow these instructions at home: If you have a splint: Wear the splint as told by your health care provider. Remove it only as told by your health care provider. Loosen it if your fingers tingle, become numb, or turn cold and blue. Keep it clean. If the splint is not waterproof: Do not let it get wet. Cover it with a watertight covering when you take a bath or shower. Managing pain, stiffness, and swelling     If directed, apply heat to the affected area as often as told by your health care provider. Use the heat source that your health care provider recommends, such as a moist heat pack or a heating pad. Place a towel between your skin and the heat source. Leave the heat on for 20-30 minutes. Remove the heat if your skin turns bright red. This is especially important if you are unable to feel  pain, heat, or cold. You may have a greater risk of getting burned. If directed, put ice on the painful area. To do this: If you have a removable splint, remove it as told by your health care provider. Put ice in a plastic bag. Place a towel between your skin and the bag or between your splint and the bag. Leave the ice on for 20 minutes, 2-3 times a day.  Activity Rest your finger as told by your health care provider. Avoid activities that make the  pain worse. Return to your normal activities as told by your health care provider. Ask your health care provider what activities are safe for you. Do exercises as told by your health care provider. Ask your health care provider when it is safe to drive if you have a splint on your hand. General instructions Take over-the-counter and prescription medicines only as told by your health care provider. Keep all follow-up visits as told by your health care provider. This is important. Contact a health care provider if: Your symptoms are not improving with home care. Summary Trigger finger, also called stenosing tenosynovitis, causes your finger to get stuck in a bent position. This can make it difficult and painful to straighten your finger. This condition develops when a finger tendon or tendon sheath thickens. Treatment may include resting your finger, wearing a splint, and taking medicines. In severe cases, surgery to open the tendon sheath may be needed. This information is not intended to replace advice given to you by your health care provider. Make sure you discuss any questions you have with your health care provider. Document Revised: 01/25/2019 Document Reviewed: 01/25/2019 Elsevier Patient Education  [place].

## 2022-03-11 NOTE — Progress Notes (Signed)
Subjective:  Patient ID: Bradley Ramirez, male    DOB: October 30, 1940  Age: 81 y.o. MRN: [id number]  CC:  Chief Complaint  Patient presents with   Hypertension   Chronic Kidney Disease   Arthritis    Pt has been having trouble with arthritis in his hands over last several month as struggled to lift things     HPI Bradley Ramirez presents for   Hypertension: With history of chronic kidney disease, dilated cardiomyopathy, cardiologist Dr. Percival Spanish.  Last visit 05/12/2020.  EF low normal and August 2022 but unchanged from previous on echo.  Mild aortic insufficiency. Diltiazem XR 180 mg daily Home readings: normal most of the time No NSAIDs.  Some walking for exercise. Some naps at time. Feeling well.  BP Readings from Last 3 Encounters:  03/11/22 126/78  09/20/21 134/72  03/14/21 130/70   Lab Results  Component Value Date   CREATININE 1.43 09/20/2021   Esophageal dysmotility, presbyesophagus With gastritis previously.  Seen by GI.  Option of manometry or other testing but well controlled on PPI.  Continued monitoring at his December visit. Denies recent difficulty swallowing. Taking omeprazole QAM,  no heartburn.   Asthma Well-controlled with Flovent once per day.  No breakthrough symptoms.  Hand pain Ring fingers sore both hands at times, fingers freeze/lock at times. Notices with lifting. Tx: tylenol.   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   Deafness    Pt does sign language!   Diverticulosis    Dysphagia, unspecified(787.20)    Esophageal spasm    Esophageal stricture    GERD (gastroesophageal reflux disease)    no meds   H. pylori infection    Hiatal hernia    Hypertension    Internal hemorrhoids    S/P colonoscopy    Past Surgical History:  Procedure Laterality Date   BIOPSY  09/17/2019   Procedure: BIOPSY;  Surgeon: Jackquline Denmark, MD;  Location[facility] ENDOSCOPY;  Service: Endoscopy;;   COLONOSCOPY     ESOPHAGEAL DILATION  09/17/2019   Procedure: ESOPHAGEAL MALONEY DILATION;  Surgeon: Jackquline Denmark, MD;  Location: [facility] ENDOSCOPY;  Service: Endoscopy;;   ESOPHAGOGASTRODUODENOSCOPY (EGD) WITH PROPOFOL N/A 09/17/2019   Procedure: ESOPHAGOGASTRODUODENOSCOPY (EGD) WITH PROPOFOL;  Surgeon: Jackquline Denmark, MD;  Location: [facility] ENDOSCOPY;  Service: Endoscopy;  Laterality: N/A;   TONSILLECTOMY     as a child   UPPER GASTROINTESTINAL ENDOSCOPY     No Known Allergies Prior to Admission medications   Medication Sig Start Date End Date Taking? Authorizing Provider  DILT-XR 180 MG 24 hr capsule TAKE 1 CAPSULE BY MOUTH EVERY DAY 08/03/21  Yes Wendie Agreste, MD  fluticasone (FLOVENT HFA) 44 MCG/ACT inhaler TAKE 2 PUFFS BY MOUTH TWICE A DAY 09/20/21  Yes Wendie Agreste, MD  omeprazole (PRILOSEC) 20 MG capsule Take 1 capsule (20 mg total)  by mouth 2 (two) times daily before a meal. 09/20/21  Yes Wendie Agreste, MD   Social History   Socioeconomic History   Marital status: Married    Spouse name: Not on file   Number of children: 4   Years of education: Not on file   Highest education level: Some college, no degree  Occupational History   Occupation: retired    Fish farm manager: [place]  Tobacco Use   Smoking status: Former    Types: Cigarettes    Quit date: 1987    Years since quitting: 36.4   Smokeless tobacco: Never  Vaping Use   Vaping Use:  Never used  Substance and Sexual Activity   Alcohol use: No    Comment: quit 1987   Drug use: No   Sexual activity: Not on file  Other Topics Concern   Not on file  Social History Narrative   Married   Works as custodian   Education:  [place] Strain: [place]  (09/29/2017)   Overall Financial Resource Strain (CARDIA)    Difficulty of Paying Living Expenses: Not hard at all  Food Insecurity: No [place] (09/29/2017)   Hunger Vital Sign    Worried About Running Out of Food in the Last Year: Never true    [place] in the Last Year: Never true  Transportation Needs: No Transportation Needs (09/29/2017)   PRAPARE - Hydrologist (Medical): No    Lack of Transportation (Non-Medical): No  Physical Activity: Inactive (09/29/2017)   Exercise Vital Sign    Days of Exercise per Week: 0 days    Minutes of Exercise per Session: 0 min  Stress: No Stress Concern Present (09/29/2017)   [place]    Feeling of Stress : Not at all  Social Connections: [place] (09/29/2017)   Social Connection and Isolation Panel [NHANES]    Frequency of Communication with Friends and Family: More than three times a week    Frequency of Social Gatherings with Friends and Family: More than three times a week    Attends Religious Services: More than 4 times per year    Active Member of [employer] or Organizations: Yes    Attends Music therapist: More than 4 times per year    Marital Status: Married  Human resources officer Violence: Not At Risk (09/29/2017)   Humiliation, Afraid, Rape, and Kick questionnaire    Fear of Current or Ex-Partner: No    Emotionally Abused: No    Physically Abused: No    Sexually Abused: No    Review of Systems  Constitutional:  Negative for fatigue and unexpected weight change.  Eyes:  Negative for visual  disturbance.  Respiratory:  Negative for cough, chest tightness and shortness of breath.   Cardiovascular:  Negative for chest pain, palpitations and leg swelling.  Gastrointestinal:  Negative for abdominal pain and blood in stool.  Neurological:  Negative for dizziness, light-headedness and headaches.     Objective:   Vitals:   03/11/22 0800  BP: 126/78  Pulse: 71  Resp: 16  Temp: 98.3 F (36.8 C)  TempSrc: Temporal  SpO2: 98%  Weight: 149 lb (67.6 kg)  Height: '6\' 2"'$  (1.88 m)     Physical Exam Vitals reviewed.  Constitutional:      Appearance: He is well-developed.  HENT:     Head: Normocephalic  and atraumatic.  Neck:     Vascular: No carotid bruit or JVD.  Cardiovascular:     Rate and Rhythm: Normal rate and regular rhythm.     Heart sounds: Normal heart sounds. No murmur heard. Pulmonary:     Effort: Pulmonary effort is normal.     Breath sounds: Normal breath sounds. No rales.  Musculoskeletal:     Right lower leg: No edema.     Left lower leg: No edema.     Comments: Bilateral hands with full range of motion at MCP, PIP, DIP appreciated without significant bony prominence of interphalangeal joints.  No apparent triggering noted on exam at this time but he does note the areas of the MCP to PIP where area appears to lock when it does occur.  Skin:    General: Skin is warm and dry.  Neurological:     Mental Status: He is alert and oriented to person, place, and time.  Psychiatric:        Mood and Affect: Mood normal.        Assessment & Plan:  Bradley Ramirez is a 81 y.o. male . Pain in both hands  -New concern.  Primarily fourth phalanges, symptoms consistent with likely trigger finger versus arthritic joint.  Reassuring exam at present.  Option of Ortho/hand eval but declined at this time.  Handout given.  Topical treatment with diclofenac gel if needed, but managed with Tylenol currently.  RTC precautions.  Presbyesophagus Esophageal  dysmotility  -Stable with daily PPI.  Continue same  Stage 3b chronic kidney disease (HCC)  -Check BMP, maintain hydration, continue to avoid oral NSAIDs  Essential hypertension - Plan: Basic metabolic panel, diltiazem (DILT-XR) 180 MG 24 hr capsule  -Stable, continue diltiazem same dose, check labs.  Mild intermittent asthma, uncomplicated - Plan: fluticasone (FLOVENT HFA) 44 MCG/ACT inhaler  -Stable with once daily use of Flovent.  Option of twice daily dosing, RTC precautions.  Continue same prescription for now.  Meds ordered this encounter  Medications   diltiazem (DILT-XR) 180 MG 24 hr capsule    Sig: TAKE 1 CAPSULE BY MOUTH EVERY DAY    Dispense:  90 capsule    Refill:  3   fluticasone (FLOVENT HFA) 44 MCG/ACT inhaler    Sig: TAKE 2 PUFFS BY MOUTH TWICE A DAY    Dispense:  31.8 each    Refill:  6   Patient Instructions  Pain in hands could be a trigger finger or arthritis of joints.  Okay to continue Tylenol, Voltaren cream if needed but do not take any oral NSAIDs like Advil or Aleve by mouth.  No other medication changes today.  Let me know if the hand pain is worsening or when you would like to meet with hand specialist and I will place that referral.  Follow-up in 6 months.  Take care!  Trigger Finger  Trigger finger, also called stenosing tenosynovitis,  is a condition that causes a finger to get stuck in a bent position. Each finger has a tendon, which is a tough, cord-like tissue that connects muscle to bone, and each tendon passes through a tunnel of tissue called a tendon sheath. To move your finger, your tendon needs to glide freely through the sheath. Trigger finger happens when the tendon or the sheath thickens, making it difficult to move your finger. Trigger finger can affect any finger or a thumb. It may affect more than one finger. Mild cases may clear up with rest and medicine.  Severe cases require more treatment. What are the causes? Trigger finger is caused by  a thickened finger tendon or tendon sheath. The cause of this thickening is not known. What increases the risk? The following factors may make you more likely to develop this condition: Doing activities that require a strong grip. Having rheumatoid arthritis, gout, or diabetes. Being 85-31 years old. Being male. What are the signs or symptoms? Symptoms of this condition include: Pain when bending or straightening your finger. Tenderness or swelling where your finger attaches to the palm of your hand. A lump in the palm of your hand or on the inside of your finger. Hearing a noise like a pop or a snap when you try to straighten your finger. Feeling a catching or locking sensation when you try to straighten your finger. Being unable to straighten your finger. How is this diagnosed? This condition is diagnosed based on your symptoms and a physical exam. How is this treated? This condition may be treated by: Resting your finger and avoiding activities that make symptoms worse. Wearing a finger splint to keep your finger extended. Taking NSAIDs, such as ibuprofen, to relieve pain and swelling. Doing gentle exercises to stretch the finger as told by your health care provider. Having medicine that reduces swelling and inflammation (steroids) injected into the tendon sheath. Injections may need to be repeated. Having surgery to open the tendon sheath. This may be done if other treatments do not work and you cannot straighten your finger. You may need physical therapy after surgery. Follow these instructions at home: If you have a splint: Wear the splint as told by your health care provider. Remove it only as told by your health care provider. Loosen it if your fingers tingle, become numb, or turn cold and blue. Keep it clean. If the splint is not waterproof: Do not let it get wet. Cover it with a watertight covering when you take a bath or shower. Managing pain, stiffness, and swelling      If directed, apply heat to the affected area as often as told by your health care provider. Use the heat source that your health care provider recommends, such as a moist heat pack or a heating pad. Place a towel between your skin and the heat source. Leave the heat on for 20-30 minutes. Remove the heat if your skin turns bright red. This is especially important if you are unable to feel pain, heat, or cold. You may have a greater risk of getting burned. If directed, put ice on the painful area. To do this: If you have a removable splint, remove it as told by your health care provider. Put ice in a plastic bag. Place a towel between your skin and the bag or between your splint and the bag. Leave the ice on for 20 minutes, 2-3 times a day.  Activity Rest your finger as told by your health care provider. Avoid activities that make the pain worse. Return to your normal activities as told by your health care provider. Ask your health care provider what activities are safe for you. Do exercises as told by your health care provider. Ask your health care provider when it is safe to drive if you have a splint on your hand. General instructions Take over-the-counter and prescription medicines only as told by your health care provider. Keep all follow-up visits as told by your health care provider. This is important. Contact a health care provider if: Your symptoms are not improving  with home care. Summary Trigger finger, also called stenosing tenosynovitis, causes your finger to get stuck in a bent position. This can make it difficult and painful to straighten your finger. This condition develops when a finger tendon or tendon sheath thickens. Treatment may include resting your finger, wearing a splint, and taking medicines. In severe cases, surgery to open the tendon sheath may be needed. This information is not intended to replace advice given to you by your health care provider. Make sure you  discuss any questions you have with your health care provider. Document Revised: 01/25/2019 Document Reviewed: 01/25/2019 Elsevier Patient Education  [place],   [name], MD [place], [facility] 03/11/22 8:34 AM

## 2022-05-10 ENCOUNTER — Other Ambulatory Visit (HOSPITAL_COMMUNITY): Payer: Self-pay

## 2022-05-10 DIAGNOSIS — R1310 Dysphagia, unspecified: Secondary | ICD-10-CM

## 2022-05-20 ENCOUNTER — Ambulatory Visit (HOSPITAL_COMMUNITY): Admission: RE | Admit: 2022-05-20 | Payer: Medicare PPO | Source: Ambulatory Visit

## 2022-05-20 ENCOUNTER — Ambulatory Visit (HOSPITAL_COMMUNITY): Payer: Medicare PPO

## 2022-05-20 ENCOUNTER — Encounter (HOSPITAL_COMMUNITY): Payer: Self-pay

## 2022-09-15 ENCOUNTER — Other Ambulatory Visit: Payer: Self-pay | Admitting: Family Medicine

## 2022-09-15 DIAGNOSIS — K2289 Other specified disease of esophagus: Secondary | ICD-10-CM

## 2022-09-15 DIAGNOSIS — K224 Dyskinesia of esophagus: Secondary | ICD-10-CM

## 2022-09-18 ENCOUNTER — Encounter: Payer: Self-pay | Admitting: Family Medicine

## 2022-09-18 ENCOUNTER — Ambulatory Visit (INDEPENDENT_AMBULATORY_CARE_PROVIDER_SITE_OTHER): Payer: Medicare PPO | Admitting: Family Medicine

## 2022-09-18 VITALS — BP 128/70 | HR 68 | Temp 97.6°F | Ht 74.0 in | Wt 154.0 lb

## 2022-09-18 DIAGNOSIS — I10 Essential (primary) hypertension: Secondary | ICD-10-CM

## 2022-09-18 DIAGNOSIS — Z0000 Encounter for general adult medical examination without abnormal findings: Secondary | ICD-10-CM | POA: Diagnosis not present

## 2022-09-18 DIAGNOSIS — K224 Dyskinesia of esophagus: Secondary | ICD-10-CM

## 2022-09-18 DIAGNOSIS — J4520 Mild intermittent asthma, uncomplicated: Secondary | ICD-10-CM

## 2022-09-18 DIAGNOSIS — N1832 Chronic kidney disease, stage 3b: Secondary | ICD-10-CM

## 2022-09-18 DIAGNOSIS — K2289 Other specified disease of esophagus: Secondary | ICD-10-CM

## 2022-09-18 MED ORDER — FLUTICASONE PROPIONATE HFA 44 MCG/ACT IN AERO: INHALATION_SPRAY | RESPIRATORY_TRACT | 6 refills | Status: DC

## 2022-09-18 MED ORDER — DILTIAZEM HCL ER 180 MG PO CP24: ORAL_CAPSULE | ORAL | 3 refills | Status: DC

## 2022-09-18 NOTE — Patient Instructions (Addendum)
No med changes at this time. I will let you know if any concerns on labs.  Take care!  Preventive Care 31 Years and Older, Male Preventive care refers to lifestyle choices and visits with your health care provider that can promote health and wellness. Preventive care visits are also called wellness exams. What can I expect for my preventive care visit? Counseling During your preventive care visit, your health care provider may ask about your: Medical history, including: Past medical problems. Family medical history. History of falls. Current health, including: Emotional well-being. Home life and relationship well-being. Sexual activity. Memory and ability to understand (cognition). Lifestyle, including: Alcohol, nicotine or tobacco, and drug use. Access to firearms. Diet, exercise, and sleep habits. Work and work Statistician. Sunscreen use. Safety issues such as seatbelt and bike helmet use. Physical exam Your health care provider will check your: Height and weight. These may be used to calculate your BMI (body mass index). BMI is a measurement that tells if you are at a healthy weight. Waist circumference. This measures the distance around your waistline. This measurement also tells if you are at a healthy weight and may help predict your risk of certain diseases, such as type 2 diabetes and high blood pressure. Heart rate and blood pressure. Body temperature. Skin for abnormal spots. What immunizations do I need?  Vaccines are usually given at various ages, according to a schedule. Your health care provider will recommend vaccines for you based on your age, medical history, and lifestyle or other factors, such as travel or where you work. What tests do I need? Screening Your health care provider may recommend screening tests for certain conditions. This may include: Lipid and cholesterol levels. Diabetes screening. This is done by checking your blood sugar (glucose) after you  have not eaten for a while (fasting). Hepatitis C test. Hepatitis B test. HIV (human immunodeficiency virus) test. STI (sexually transmitted infection) testing, if you are at risk. Lung cancer screening. Colorectal cancer screening. Prostate cancer screening. Abdominal aortic aneurysm (AAA) screening. You may need this if you are a current or former smoker. Talk with your health care provider about your test results, treatment options, and if necessary, the need for more tests. Follow these instructions at home: Eating and drinking  Eat a diet that includes fresh fruits and vegetables, whole grains, lean protein, and low-fat dairy products. Limit your intake of foods with high amounts of sugar, saturated fats, and salt. Take vitamin and mineral supplements as recommended by your health care provider. Do not drink alcohol if your health care provider tells you not to drink. If you drink alcohol: Limit how much you have to 0-2 drinks a day. Know how much alcohol is in your drink. In the [place] one drink equals one 12 oz bottle of beer (355 mL), one 5 oz glass of wine (148 mL), or one 1 oz glass of hard liquor (44 mL). Lifestyle Brush your teeth every morning and night with fluoride toothpaste. Floss one time each day. Exercise for at least 30 minutes 5 or more days each week. Do not use any products that contain nicotine or tobacco. These products include cigarettes, chewing tobacco, and vaping devices, such as e-cigarettes. If you need help quitting, ask your health care provider. Do not use drugs. If you are sexually active, practice safe sex. Use a condom or other form of protection to prevent STIs. Take aspirin only as told by your health care provider. Make sure that you understand how much  to take and what form to take. Work with your health care provider to find out whether it is safe and beneficial for you to take aspirin daily. Ask your health care provider if you need to take a  cholesterol-lowering medicine (statin). Find healthy ways to manage stress, such as: Meditation, yoga, or listening to music. Journaling. Talking to a trusted person. Spending time with friends and family. Safety Always wear your seat belt while driving or riding in a vehicle. Do not drive: If you have been drinking alcohol. Do not ride with someone who has been drinking. When you are tired or distracted. While texting. If you have been using any mind-altering substances or drugs. Wear a helmet and other protective equipment during sports activities. If you have firearms in your house, make sure you follow all gun safety procedures. Minimize exposure to UV radiation to reduce your risk of skin cancer. What's next? Visit your health care provider once a year for an annual wellness visit. Ask your health care provider how often you should have your eyes and teeth checked. Stay up to date on all vaccines. This information is not intended to replace advice given to you by your health care provider. Make sure you discuss any questions you have with your health care provider. Document Revised: 03/07/2021 Document Reviewed: 03/07/2021 Elsevier Patient Education  [place].

## 2022-09-18 NOTE — Progress Notes (Signed)
Subjective:  Patient ID: Bradley Ramirez, male    DOB: [birth year]  Age: 81 y.o. MRN: [id number]  CC:  Chief Complaint  Patient presents with   Annual Exam    Pt states all is well    HPI Bradley Ramirez presents for Annual Exam Here with sign language interpreter. Doing ok. Taking time. No new health issues.  Occasional tylenol helps some R elbow soreness at times, no limitations. No nsaids. Still working part time.   Hypertension: With history of stage IIIb CKD, dilated cardiomyopathy, cardiologist Dr. Percival Spanish.  Low normal EF in the past but unchanged from previous echoes.  Diltiazem XR 180 mg daily. No new side effects with meds.  Home readings: 135-145/70-80 BP Readings from Last 3 Encounters:  09/18/22 128/70  03/11/22 126/78  09/20/21 134/72   Lab Results  Component Value Date   CREATININE 1.47 03/11/2022   Esophageal dysmotility, presbyesophagus Previously seen by gastroenterology well-controlled on PPI.  Option of manometry other testing if worsening symptoms.  Omeprazole daily.  Doing well currently.  Denies difficulty swallowing.   Asthma Flovent daily for asthma.  Stable. Uses 2 puffs per day and stable.       09/18/2022    2:03 PM 09/20/2021    3:01 PM 01/17/2021    2:30 PM 07/13/2020    3:08 PM 11/17/2019    4:06 PM  Depression screen PHQ 2/9  Decreased Interest 0 0 0 0 0  Down, Depressed, Hopeless 0 0 0 0 0  PHQ - 2 Score 0 0 0 0 0  Altered sleeping 0 0     Tired, decreased energy 0 0     Change in appetite 0 3     Feeling bad or failure about yourself  0 0     Trouble concentrating 0 0     Moving slowly or fidgety/restless 0 0     Suicidal thoughts 0 0     PHQ-9 Score 0 3       Health Maintenance  Topic Date Due   DTaP/Tdap/Td (1 - Tdap) Never done   Medicare Annual Wellness (AWV)  09/29/2018   COVID-19 Vaccine (7 - 2023-24 season) 10/04/2022 (Originally 05/24/2022)   Pneumonia Vaccine 74+ Years old  Completed   INFLUENZA VACCINE   Completed   Zoster Vaccines- Shingrix  Completed   HPV VACCINES  Aged Out  Option of Tdap at pharmacy, but discussed possible out of pocket cost, advised to check with his insurance.  Immunization History  Administered Date(s) Administered   Fluad Quad(high Dose 65+) 06/03/2019, 06/08/2022   Influenza Split 07/20/2012, 07/02/2014   Influenza, High Dose Seasonal PF 07/09/2016, 07/11/2017, 07/16/2018   Influenza,inj,Quad PF,6+ Mos 08/02/2013, 07/17/2015   Influenza-Unspecified 07/24/2016, 06/17/2020   PFIZER(Purple Top)SARS-COV-2 Vaccination 11/05/2019, 11/28/2019, 07/09/2020, 02/27/2021, 08/24/2021, 01/19/2022   Pneumococcal Conjugate-13 07/11/2014   Pneumococcal Polysaccharide-23 07/17/2015   Respiratory Syncytial Virus Vaccine,Recomb Aduvanted(Arexvy) 08/20/2022   Zoster Recombinat (Shingrix) 04/23/2019, 07/28/2020  Has received covid booster and RSV vaccine at his pharmacy.   No results found. Ophthalmology Dr. Gershon Crane, office visit in July.  Pseudophakia.  Dental: dentures.   Alcohol: none.   Tobacco: none.   Exercise: still working part time, walking and stairs. Cleaning offices, dusting.    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   Deafness    Pt does sign language!   Diverticulosis    Dysphagia, unspecified(787.20)    Esophageal spasm    Esophageal stricture     GERD (gastroesophageal reflux disease)    no meds   H. pylori infection    Hiatal hernia    Hypertension    Internal hemorrhoids    S/P colonoscopy    Past Surgical History:  Procedure Laterality Date   BIOPSY  09/17/2019   Procedure: BIOPSY;  Surgeon: Jackquline Denmark, MD;  Location: [facility] ENDOSCOPY;  Service: Endoscopy;;   COLONOSCOPY     ESOPHAGEAL DILATION  09/17/2019   Procedure: ESOPHAGEAL MALONEY DILATION;  Surgeon: Jackquline Denmark, MD;  Location[facility] ENDOSCOPY;  Service: Endoscopy;;   ESOPHAGOGASTRODUODENOSCOPY (EGD) WITH PROPOFOL N/A 09/17/2019   Procedure: ESOPHAGOGASTRODUODENOSCOPY (EGD) WITH PROPOFOL;  Surgeon: Jackquline Denmark, MD;  Location: [facility] ENDOSCOPY;  Service: Endoscopy;  Laterality: N/A;   TONSILLECTOMY     as a child   UPPER GASTROINTESTINAL ENDOSCOPY     No Known Allergies Prior to Admission medications   Medication Sig Start Date End Date Taking? Authorizing Provider  diltiazem (DILT-XR) 180 MG 24 hr capsule TAKE 1 CAPSULE BY MOUTH EVERY DAY 03/11/22  Yes Wendie Agreste, MD  fluticasone (FLOVENT HFA) 44 MCG/ACT inhaler TAKE 2 PUFFS BY MOUTH TWICE A DAY 03/11/22  Yes Wendie Agreste, MD  omeprazole (PRILOSEC) 20 MG capsule TAKE 1 CAPSULE (20 MG TOTAL) BY MOUTH 2 (TWO) TIMES DAILY BEFORE A MEAL. 09/16/22  Yes Wendie Agreste, MD   Social History   Socioeconomic History   Marital status: Married    Spouse name: Not on file   Number of children: 4   Years of education: Not on file   Highest education level: Some college, no degree  Occupational History   Occupation: retired    Fish farm manager: [employer]  Tobacco Use   Smoking status: Former    Types: Cigarettes    Quit date: 1987    Years since quitting: 37.0   Smokeless tobacco: Never  Vaping Use   Vaping Use: Never used  Substance and Sexual Activity   Alcohol use: No    Comment: quit 1987   Drug use: No   Sexual activity: Not on file  Other Topics Concern   Not on file  Social History Narrative   Married    Works as custodian   Education:  [place] Strain: [place]  (09/29/2017)   Overall Financial Resource Strain (CARDIA)    Difficulty of Paying Living Expenses: Not hard at all  Food Insecurity: No [place] (09/29/2017)   Hunger Vital Sign    Worried About Running Out of Food in the Last Year: Never true    [place] in the Last Year: Never true  Transportation Needs: No Transportation Needs (09/29/2017)   PRAPARE - Hydrologist (Medical): No    Lack of Transportation (Non-Medical): No  Physical Activity: Inactive (09/29/2017)  Exercise Vital Sign    Days of Exercise per Week: 0 days    Minutes of Exercise per Session: 0 min  Stress: No Stress Concern Present (09/29/2017)   [place]    Feeling of Stress : Not at all  Social Connections: [place] (09/29/2017)   Social Connection and Isolation Panel [NHANES]    Frequency of Communication with Friends and Family: More than three times a week    Frequency of Social Gatherings with Friends and Family: More than three times a week    Attends Religious Services: More than 4 times per year    Active Member of [employer] or Organizations: Yes    Attends Archivist Meetings: More than 4 times per year    Marital Status: Married  Human resources officer Violence: Not At Risk (09/29/2017)   Humiliation, Afraid, Rape, and Kick questionnaire    Fear of Current or Ex-Partner: No    Emotionally Abused: No    Physically Abused: No    Sexually Abused: No    Review of Systems 13 point review of systems per patient health survey noted.  Negative other than as indicated above or in HPI.    Objective:   Vitals:   09/18/22 1406  BP: 128/70  Pulse: 68  Temp: 97.6 F (36.4 C)  SpO2: 97%  Weight: 154 lb (69.9 kg)  Height: '6\' 2"'$  (1.88 m)     Physical Exam Vitals reviewed.   Constitutional:      Appearance: He is well-developed.  HENT:     Head: Normocephalic and atraumatic.     Right Ear: External ear normal.     Left Ear: External ear normal.  Eyes:     Conjunctiva/sclera: Conjunctivae normal.     Pupils: Pupils are equal, round, and reactive to light.  Neck:     Thyroid: No thyromegaly.  Cardiovascular:     Rate and Rhythm: Normal rate and regular rhythm.     Heart sounds: Normal heart sounds.  Pulmonary:     Effort: Pulmonary effort is normal. No respiratory distress.     Breath sounds: Normal breath sounds. No wheezing.  Abdominal:     General: There is no distension.     Palpations: Abdomen is soft.     Tenderness: There is no abdominal tenderness.  Musculoskeletal:        General: No tenderness. Normal range of motion.     Cervical back: Normal range of motion and neck supple.  Lymphadenopathy:     Cervical: No cervical adenopathy.  Skin:    General: Skin is warm and dry.  Neurological:     Mental Status: He is alert and oriented to person, place, and time.     Deep Tendon Reflexes: Reflexes are normal and symmetric.  Psychiatric:        Behavior: Behavior normal.     Assessment & Plan:  Bradley Ramirez is a 81 y.o. male . Annual physical exam  - -anticipatory guidance as below in AVS, screening labs above. Health maintenance items as above in HPI discussed/recommended as applicable.   Presbyesophagus Esophageal dysmotility  -Stable control with use of PPI.  Continue same.  Denies dysphagia/odynophagia.  Option of GI follow-up if worsening of symptoms for other testing.  Mild intermittent asthma, uncomplicated - Plan: fluticasone (FLOVENT HFA) 44 MCG/ACT inhaler  -Stable with current use Flovent, continue same with option of higher dosing.  Stage 3b chronic kidney disease ([place]) - Plan:  Basic metabolic panel Essential hypertension - Plan: Basic metabolic panel, diltiazem (DILT-XR) 180 MG 24 hr capsule  -Stable, continue same  dose diltiazem with labs pending as above.  Continue to avoid nephrotoxins, maintain hydration.  Meds ordered this encounter  Medications   diltiazem (DILT-XR) 180 MG 24 hr capsule    Sig: TAKE 1 CAPSULE BY MOUTH EVERY DAY    Dispense:  90 capsule    Refill:  3   fluticasone (FLOVENT HFA) 44 MCG/ACT inhaler    Sig: TAKE 2 PUFFS BY MOUTH TWICE A DAY    Dispense:  31.8 each    Refill:  6   Patient Instructions  No med changes at this time. I will let you know if any concerns on labs.  Take care!  Preventive Care 88 Years and Older, Male Preventive care refers to lifestyle choices and visits with your health care provider that can promote health and wellness. Preventive care visits are also called wellness exams. What can I expect for my preventive care visit? Counseling During your preventive care visit, your health care provider may ask about your: Medical history, including: Past medical problems. Family medical history. History of falls. Current health, including: Emotional well-being. Home life and relationship well-being. Sexual activity. Memory and ability to understand (cognition). Lifestyle, including: Alcohol, nicotine or tobacco, and drug use. Access to firearms. Diet, exercise, and sleep habits. Work and work Statistician. Sunscreen use. Safety issues such as seatbelt and bike helmet use. Physical exam Your health care provider will check your: Height and weight. These may be used to calculate your BMI (body mass index). BMI is a measurement that tells if you are at a healthy weight. Waist circumference. This measures the distance around your waistline. This measurement also tells if you are at a healthy weight and may help predict your risk of certain diseases, such as type 2 diabetes and high blood pressure. Heart rate and blood pressure. Body temperature. Skin for abnormal spots. What immunizations do I need?  Vaccines are usually given at various ages,  according to a schedule. Your health care provider will recommend vaccines for you based on your age, medical history, and lifestyle or other factors, such as travel or where you work. What tests do I need? Screening Your health care provider may recommend screening tests for certain conditions. This may include: Lipid and cholesterol levels. Diabetes screening. This is done by checking your blood sugar (glucose) after you have not eaten for a while (fasting). Hepatitis C test. Hepatitis B test. HIV (human immunodeficiency virus) test. STI (sexually transmitted infection) testing, if you are at risk. Lung cancer screening. Colorectal cancer screening. Prostate cancer screening. Abdominal aortic aneurysm (AAA) screening. You may need this if you are a current or former smoker. Talk with your health care provider about your test results, treatment options, and if necessary, the need for more tests. Follow these instructions at home: Eating and drinking  Eat a diet that includes fresh fruits and vegetables, whole grains, lean protein, and low-fat dairy products. Limit your intake of foods with high amounts of sugar, saturated fats, and salt. Take vitamin and mineral supplements as recommended by your health care provider. Do not drink alcohol if your health care provider tells you not to drink. If you drink alcohol: Limit how much you have to 0-2 drinks a day. Know how much alcohol is in your drink. In the [place] one drink equals one 12 oz bottle of beer (355 mL), one 5 oz glass  of wine (148 mL), or one 1 oz glass of hard liquor (44 mL). Lifestyle Brush your teeth every morning and night with fluoride toothpaste. Floss one time each day. Exercise for at least 30 minutes 5 or more days each week. Do not use any products that contain nicotine or tobacco. These products include cigarettes, chewing tobacco, and vaping devices, such as e-cigarettes. If you need help quitting, ask your health care  provider. Do not use drugs. If you are sexually active, practice safe sex. Use a condom or other form of protection to prevent STIs. Take aspirin only as told by your health care provider. Make sure that you understand how much to take and what form to take. Work with your health care provider to find out whether it is safe and beneficial for you to take aspirin daily. Ask your health care provider if you need to take a cholesterol-lowering medicine (statin). Find healthy ways to manage stress, such as: Meditation, yoga, or listening to music. Journaling. Talking to a trusted person. Spending time with friends and family. Safety Always wear your seat belt while driving or riding in a vehicle. Do not drive: If you have been drinking alcohol. Do not ride with someone who has been drinking. When you are tired or distracted. While texting. If you have been using any mind-altering substances or drugs. Wear a helmet and other protective equipment during sports activities. If you have firearms in your house, make sure you follow all gun safety procedures. Minimize exposure to UV radiation to reduce your risk of skin cancer. What's next? Visit your health care provider once a year for an annual wellness visit. Ask your health care provider how often you should have your eyes and teeth checked. Stay up to date on all vaccines. This information is not intended to replace advice given to you by your health care provider. Make sure you discuss any questions you have with your health care provider. Document Revised: 03/07/2021 Document Reviewed: 03/07/2021 Elsevier Patient Education  [place],   [name], MD [place] 09/18/22 3:30 PM

## 2022-09-19 LAB — BASIC METABOLIC PANEL
BUN: 27 mg/dL — ABNORMAL HIGH (ref 6–23)
CO2: 27 mEq/L (ref 19–32)
Calcium: 9.3 mg/dL (ref 8.4–10.5)
Chloride: 104 mEq/L (ref 96–112)
Creatinine, Ser: 1.47 mg/dL (ref 0.40–1.50)
GFR: 44.07 mL/min — ABNORMAL LOW (ref >60.00)
Glucose, Bld: 82 mg/dL (ref 70–99)
Potassium: 4.6 mEq/L (ref 3.5–5.1)
Sodium: 140 mEq/L (ref 135–145)

## 2022-09-21 ENCOUNTER — Encounter: Payer: Self-pay | Admitting: Family Medicine

## 2023-05-11 ENCOUNTER — Encounter (HOSPITAL_COMMUNITY): Payer: Self-pay | Admitting: *Deleted

## 2023-05-11 ENCOUNTER — Ambulatory Visit (HOSPITAL_COMMUNITY)
Admission: EM | Admit: 2023-05-11 | Discharge: 2023-05-11 | Disposition: A | Discharge status: Home / Self Care | Payer: Medicare PPO | Attending: Internal Medicine | Admitting: Internal Medicine

## 2023-05-11 ENCOUNTER — Other Ambulatory Visit: Payer: Self-pay

## 2023-05-11 DIAGNOSIS — Z1152 Encounter for screening for COVID-19: Secondary | ICD-10-CM | POA: Diagnosis not present

## 2023-05-11 DIAGNOSIS — J069 Acute upper respiratory infection, unspecified: Secondary | ICD-10-CM | POA: Diagnosis not present

## 2023-05-11 MED ORDER — BENZONATATE 100 MG PO CAPS: 100.0000 mg | ORAL_CAPSULE | Freq: Three times a day (TID) | ORAL | 0 refills | Status: DC

## 2023-05-11 NOTE — ED Provider Notes (Signed)
[facility]    CSN: [id number] Arrival date & time: 05/11/23  1347      History   Chief Complaint Chief Complaint  Patient presents with   COVID Test    HPI Bradley Ramirez is a 82 y.o. male.   Patient presents to urgent care for evaluation of mild cough and nasal congestion that started yesterday.  His wife was diagnosed with COVID-19 via positive home test and he is requesting PCR COVID-19 testing for himself.  Cough is nonproductive, no shortness of breath, chest pain, heart palpitations, nausea, vomiting, fever/chills, orthopnea, or leg swelling.  History of asthma, has not needed his albuterol inhaler during this illness.  He has not used any over-the-counter medications for symptoms.  The history is provided by the patient. The history is limited by a language barrier. A language interpreter was used (American sign language video interpreter used for entirety of patient encounter).    Past Medical History:  Diagnosis Date   Anxiety    Aortic valve insufficiency    Asthma    Cardiomyopathy ([facility])    Chest pain    per pt/ was resolved/ was [place] per pt.   Deafness    Pt does sign language!   Diverticulosis    Dysphagia, unspecified(787.20)    Esophageal spasm    Esophageal stricture    GERD (gastroesophageal reflux disease)    no meds   H. 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   Educated about COVID-19 virus infection 05/11/2020   Dilated cardiomyopathy ([facility]) 05/11/2020   Dysphasia 10/05/2019   Presbyesophagus 10/05/2019   Esophageal obstruction due to food impaction    Esophageal stricture    Constipation 11/24/2017   Deaf 10/28/2013   CARDIOMYOPATHY 04/20/2009   Essential hypertension 04/19/2009   HIATAL HERNIA, HX OF 04/19/2009    Past Surgical History:  Procedure Laterality  Date   BIOPSY  09/17/2019   Procedure: BIOPSY;  Surgeon: Lynann Bologna, MD;  Location: [facility] ENDOSCOPY;  Service: Endoscopy;;   COLONOSCOPY     ESOPHAGEAL DILATION  09/17/2019   Procedure: ESOPHAGEAL MALONEY DILATION;  Surgeon: Lynann Bologna, MD;  Location: [facility] ENDOSCOPY;  Service: Endoscopy;;   ESOPHAGOGASTRODUODENOSCOPY (EGD) WITH PROPOFOL N/A 09/17/2019   Procedure: ESOPHAGOGASTRODUODENOSCOPY (EGD) WITH PROPOFOL;  Surgeon: Lynann Bologna, MD;  Location: [facility] ENDOSCOPY;  Service: Endoscopy;  Laterality: N/A;   TONSILLECTOMY     as a child   UPPER GASTROINTESTINAL ENDOSCOPY         Home Medications    Prior to Admission medications   Medication Sig Start Date End Date Taking? Authorizing Provider  benzonatate (TESSALON) 100 MG capsule Take 1 capsule (100 mg total) by mouth every 8 (eight) hours. 05/11/23  Yes Carlisle Beers, FNP  diltiazem (DILT-XR) 180 MG 24 hr capsule TAKE 1 CAPSULE BY MOUTH EVERY DAY 09/18/22  Yes Shade Flood, MD  fluticasone (FLOVENT HFA) 44 MCG/ACT inhaler TAKE 2 PUFFS BY MOUTH TWICE A DAY 09/18/22  Yes Shade Flood, MD  omeprazole (PRILOSEC) 20 MG capsule TAKE 1 CAPSULE (20 MG TOTAL) BY MOUTH 2 (TWO) TIMES DAILY BEFORE A MEAL. 09/16/22  Yes Shade Flood, MD    Family History Family History  Problem Relation Age of Onset   Uterine cancer Mother    Diabetes Maternal Grandmother  Diabetes Maternal Grandfather    Uterine cancer Maternal Aunt    Diabetes Maternal Uncle    Colon cancer Neg Hx    Esophageal cancer Neg Hx    Stomach cancer Neg Hx    Pancreatic cancer Neg Hx    Liver disease Neg Hx     Social History Social History   Tobacco Use   Smoking status: Former    Current packs/day: 0.00    Types: Cigarettes    Quit date: 1987    Years since quitting: 37.6   Smokeless tobacco: Never  Vaping Use   Vaping status: Never Used  Substance Use Topics   Alcohol use: No    Comment: quit 1987   Drug use: No     Allergies    Patient has no known allergies.   Review of Systems Review of Systems Per HPI  Physical Exam Triage Vital Signs ED Triage Vitals  Encounter Vitals Group     BP 05/11/23 1500 (!) 152/87     Systolic BP Percentile --      Diastolic BP Percentile --      Pulse Rate 05/11/23 1500 78     Resp 05/11/23 1500 20     Temp 05/11/23 1500 99.2 F (37.3 C)     Temp src --      SpO2 05/11/23 1500 96 %     Weight --      Height --      Head Circumference --      Peak Flow --      Pain Score 05/11/23 1458 0     Pain Loc --      Pain Education --      Exclude from Growth Chart --    No data found.  Updated Vital Signs BP (!) 152/87   Pulse 78   Temp 99.2 F (37.3 C)   Resp 20   SpO2 96%   Visual Acuity Right Eye Distance:   Left Eye Distance:   Bilateral Distance:    Right Eye Near:   Left Eye Near:    Bilateral Near:     Physical Exam Vitals and nursing note reviewed.  Constitutional:      Appearance: He is not ill-appearing or toxic-appearing.  HENT:     Head: Normocephalic and atraumatic.     Right Ear: Hearing and external ear normal.     Left Ear: Hearing and external ear normal.     Nose: Nose normal.     Mouth/Throat:     Lips: Pink.     Mouth: Mucous membranes are moist. No injury.     Tongue: No lesions. Tongue does not deviate from midline.     Palate: No mass and lesions.     Pharynx: Oropharynx is clear. Uvula midline. No pharyngeal swelling, oropharyngeal exudate, posterior oropharyngeal erythema or uvula swelling.     Tonsils: No tonsillar exudate or tonsillar abscesses.  Eyes:     General: Lids are normal. Vision grossly intact. Gaze aligned appropriately.     Extraocular Movements: Extraocular movements intact.     Conjunctiva/sclera: Conjunctivae normal.  Cardiovascular:     Rate and Rhythm: Normal rate and regular rhythm.     Heart sounds: Normal heart sounds, S1 normal and S2 normal.  Pulmonary:     Effort: Pulmonary effort is normal. No  respiratory distress.     Breath sounds: Normal breath sounds and air entry.  Musculoskeletal:     Cervical back: Neck supple.  Skin:    General: Skin is warm and dry.     Capillary Refill: Capillary refill takes less than 2 seconds.     Findings: No rash.  Neurological:     General: No focal deficit present.     Mental Status: He is alert and oriented to person, place, and time. Mental status is at baseline.     Cranial Nerves: No dysarthria or facial asymmetry.  Psychiatric:        Mood and Affect: Mood normal.        Speech: Speech normal.        Behavior: Behavior normal.        Thought Content: Thought content normal.        Judgment: Judgment normal.      UC Treatments / Results  Labs (all labs ordered are listed, but only abnormal results are displayed) Labs Reviewed - No data to display  EKG   Radiology No results found.  Procedures Procedures (including critical care time)  Medications Ordered in UC Medications - No data to display  Initial Impression / Assessment and Plan / UC Course  I have reviewed the triage vital signs and the nursing notes.  Pertinent labs & imaging results that were available during my care of the patient were reviewed by me and considered in my medical decision making (see chart for details).   1.  Viral URI with cough Evaluation suggests viral URI etiology. Will manage this with recommendations for OTC and prescription medications for symptomatic relief. Encouraged to push fluids to stay well hydrated.  Imaging: deferred based on stable cardiopulmonary exam/hemodynamically stable vital signs Strep/Viral testing: COVID testing pending, will call if positive. Discussed CDC guidelines  Counseled patient on potential for adverse effects with medications prescribed/recommended today, strict ER and return-to-clinic precautions discussed, patient verbalized understanding.    Final Clinical Impressions(s) / UC Diagnoses   Final  diagnoses:  Viral URI with cough  Encounter for screening for COVID-19     Discharge Instructions      Your symptoms are most likely due to a viral illness, which will improve on its own with rest and fluids. COVID testing is pending, staff will call you if this is positive. Wear a mask for 5 days of symptoms while you are in public, then you may remove your mask. You may go back to work if you do not have a fever for 24 hours without any medicines.  - Take prescribed medicines to help with symptoms: tessalon perles as needed for cough - Use over the counter medicines to help with symptoms as discussed: Tylenol, guaifenesin (mucinex) - Two teaspoons of honey in warm water every 4-6 hours may help with throat pains - Humidifier in your room at night to help add water the air and soothe cough  If you develop any new or worsening symptoms or do not improve in the next 2 to 3 days, please return.  If your symptoms are severe, please go to the emergency room.  Follow-up with PCP as needed.     ED Prescriptions     Medication Sig Dispense Auth. Provider   benzonatate (TESSALON) 100 MG capsule Take 1 capsule (100 mg total) by mouth every 8 (eight) hours. 21 capsule Carlisle Beers, FNP      PDMP not reviewed this encounter.   Carlisle Beers, [place] 05/11/23 1542

## 2023-05-11 NOTE — ED Triage Notes (Signed)
Pt reports via interpreter his wife tested positive for COVID. Pt reports he and his wife went toa cook out and his wife now has COVID. Pt has no Sx's.

## 2023-05-11 NOTE — Discharge Instructions (Signed)
Your symptoms are most likely due to a viral illness, which will improve on its own with rest and fluids. COVID testing is pending, staff will call you if this is positive. Wear a mask for 5 days of symptoms while you are in public, then you may remove your mask. You may go back to work if you do not have a fever for 24 hours without any medicines.  - Take prescribed medicines to help with symptoms: tessalon perles as needed for cough - Use over the counter medicines to help with symptoms as discussed: Tylenol, guaifenesin (mucinex) - Two teaspoons of honey in warm water every 4-6 hours may help with throat pains - Humidifier in your room at night to help add water the air and soothe cough  If you develop any new or worsening symptoms or do not improve in the next 2 to 3 days, please return.  If your symptoms are severe, please go to the emergency room.  Follow-up with PCP as needed.

## 2023-05-12 LAB — SARS CORONAVIRUS 2 (TAT 6-24 HRS): SARS Coronavirus 2: POSITIVE — AB

## 2023-06-05 ENCOUNTER — Other Ambulatory Visit: Payer: Self-pay | Admitting: Family Medicine

## 2023-06-05 DIAGNOSIS — K2289 Other specified disease of esophagus: Secondary | ICD-10-CM

## 2023-06-05 DIAGNOSIS — K224 Dyskinesia of esophagus: Secondary | ICD-10-CM

## 2023-09-22 ENCOUNTER — Encounter: Payer: Medicare PPO | Admitting: Family Medicine

## 2023-09-22 ENCOUNTER — Encounter: Payer: Self-pay | Admitting: Family Medicine

## 2023-10-03 ENCOUNTER — Other Ambulatory Visit: Payer: Self-pay | Admitting: Family Medicine

## 2023-10-03 DIAGNOSIS — I10 Essential (primary) hypertension: Secondary | ICD-10-CM

## 2023-10-23 ENCOUNTER — Encounter: Payer: Medicare PPO | Admitting: Family Medicine

## 2023-10-28 DIAGNOSIS — Z961 Presence of intraocular lens: Secondary | ICD-10-CM | POA: Diagnosis not present

## 2023-10-28 DIAGNOSIS — H26492 Other secondary cataract, left eye: Secondary | ICD-10-CM | POA: Diagnosis not present

## 2023-10-28 DIAGNOSIS — H40023 Open angle with borderline findings, high risk, bilateral: Secondary | ICD-10-CM | POA: Diagnosis not present

## 2023-11-01 ENCOUNTER — Other Ambulatory Visit: Payer: Self-pay | Admitting: Family Medicine

## 2023-11-01 DIAGNOSIS — J4520 Mild intermittent asthma, uncomplicated: Secondary | ICD-10-CM

## 2023-11-03 ENCOUNTER — Other Ambulatory Visit: Payer: Self-pay

## 2023-11-11 ENCOUNTER — Other Ambulatory Visit: Payer: Self-pay | Admitting: Family Medicine

## 2023-11-11 DIAGNOSIS — J4520 Mild intermittent asthma, uncomplicated: Secondary | ICD-10-CM

## 2023-11-11 DIAGNOSIS — K2289 Other specified disease of esophagus: Secondary | ICD-10-CM

## 2023-11-11 DIAGNOSIS — K224 Dyskinesia of esophagus: Secondary | ICD-10-CM

## 2023-11-11 MED ORDER — OMEPRAZOLE 20 MG PO CPDR
20.0000 mg | DELAYED_RELEASE_CAPSULE | Freq: Two times a day (BID) | ORAL | 0 refills | Status: AC
Start: 2023-11-11 — End: ?

## 2023-11-11 MED ORDER — FLUTICASONE PROPIONATE HFA 44 MCG/ACT IN AERO: INHALATION_SPRAY | RESPIRATORY_TRACT | 0 refills | Status: DC

## 2023-11-11 NOTE — Telephone Encounter (Signed)
Copied from CRM [id number]. Topic: Clinical - Medication Refill >> Nov 11, 2023  3:07 PM Orinda Kenner C wrote: Most Recent Primary Care Visit:  Provider: Meredith Staggers R  Department: [facility]  Visit Type: PHYSICAL  Date: 09/18/2022  Medication: omeprazole (PRILOSEC) 20 MG capsule and fluticasone (FLOVENT HFA) 44 MCG/ACT inhaler  Has the patient contacted their pharmacy? Yes, pharmacy advised patient to contact the office (Agent: If no, request that the patient contact the pharmacy for the refill. If patient does not wish to contact the pharmacy document the reason why and proceed with request.) (Agent: If yes, when and what did the pharmacy advise?)  Is this the correct pharmacy for this prescription? Yes If no, delete pharmacy and type the correct one.  This is the patient's preferred pharmacy:  [id number] - [place], [place] - [address] AT [facility] [address] [place] [address] Phone: [phone] Fax: [phone]   Has the prescription been filled recently? No  Is the patient out of the medication? Yes. Please try to refill medications today.   Has the patient been seen for an appointment in the last year OR does the patient have an upcoming appointment? Yes  Can we respond through MyChart? Yes  Agent: Please be advised that Rx refills may take up to 3 business days. We ask that you follow-up with your pharmacy.

## 2023-11-11 NOTE — Telephone Encounter (Signed)
They are requesting alternative

## 2023-11-12 ENCOUNTER — Encounter: Payer: Medicare PPO | Admitting: Family Medicine

## 2023-11-12 NOTE — Telephone Encounter (Signed)
Looks like his last visit with me was in December 2023.  He does have an appointment scheduled February 24th.  Will refill Arnuity Ellipta for now -changed from previous Flovent due to formulary coverage, please advise patient, new prescription is daily dosing.

## 2023-11-17 ENCOUNTER — Ambulatory Visit (INDEPENDENT_AMBULATORY_CARE_PROVIDER_SITE_OTHER): Payer: Medicare PPO | Admitting: Family Medicine

## 2023-11-17 ENCOUNTER — Encounter: Payer: Self-pay | Admitting: Family Medicine

## 2023-11-17 VITALS — BP 118/60 | HR 81 | Temp 97.9°F | Ht 72.25 in | Wt 149.0 lb

## 2023-11-17 DIAGNOSIS — I10 Essential (primary) hypertension: Secondary | ICD-10-CM

## 2023-11-17 DIAGNOSIS — Z0000 Encounter for general adult medical examination without abnormal findings: Secondary | ICD-10-CM

## 2023-11-17 DIAGNOSIS — J4520 Mild intermittent asthma, uncomplicated: Secondary | ICD-10-CM

## 2023-11-17 DIAGNOSIS — Z23 Encounter for immunization: Secondary | ICD-10-CM | POA: Diagnosis not present

## 2023-11-17 DIAGNOSIS — K224 Dyskinesia of esophagus: Secondary | ICD-10-CM | POA: Diagnosis not present

## 2023-11-17 DIAGNOSIS — N1832 Chronic kidney disease, stage 3b: Secondary | ICD-10-CM

## 2023-11-17 DIAGNOSIS — K2289 Other specified disease of esophagus: Secondary | ICD-10-CM

## 2023-11-17 MED ORDER — DILTIAZEM HCL ER 180 MG PO CP24: ORAL_CAPSULE | ORAL | 3 refills | Status: DC

## 2023-11-17 NOTE — Progress Notes (Unsigned)
 Subjective:  Patient ID: Bradley Ramirez, male    DOB: [birth year]  Age: 83 y.o. MRN: [id number]  CC: No chief complaint on file.   HPI Bradley Ramirez presents for Annual Exam  Presents with sign language interpreter.  No health changes.   Still working - active, walking, cleaning.   Hypertension: With stage IIIb CKD, dilated cardiomyopathy.  Cardiologist Dr. Antoine Poche.Low normal EF in the past.  Diltiazem XR 180 mg daily.  No HA, CP, dyspnea or new med side effects.    Home readings: BP Readings from Last 3 Encounters:  11/17/23 118/60  05/11/23 (!) 152/87  09/18/22 128/70   Lab Results  Component Value Date   CREATININE 1.47 09/18/2022   Esophageal dysmotility, presbyesophagus Has been well-controlled on PPI, previously evaluated by gastroenterology.  Option of manometry or other testing if worsening symptoms.  Denies difficulty swallowing recently, still taking PPI. Taking PPI every day.  Intermittent asthma Treated with daily Flovent prior - now on Arnuity  ellipta - well controlled. Rinsing after meds.      11/17/2023    4:09 PM 09/18/2022    2:03 PM 09/20/2021    3:01 PM 01/17/2021    2:30 PM 07/13/2020    3:08 PM  Depression screen PHQ 2/9  Decreased Interest 0 0 0 0 0  Down, Depressed, Hopeless 0 0 0 0 0  PHQ - 2 Score 0 0 0 0 0  Altered sleeping 0 0 0    Tired, decreased energy 0 0 0    Change in appetite 0 0 3    Feeling bad or failure about yourself  0 0 0    Trouble concentrating 0 0 0    Moving slowly or fidgety/restless 0 0 0    Suicidal thoughts 0 0 0    PHQ-9 Score 0 0 3      Health Maintenance  Topic Date Due  . DTaP/Tdap/Td (1 - Tdap) Never done  . Medicare Annual Wellness (AWV)  10/01/2019  . Pneumonia Vaccine 5+ Years old  Completed  . INFLUENZA VACCINE  Completed  . COVID-19 Vaccine  Completed  . Zoster Vaccines- Shingrix  Completed  . HPV VACCINES  Aged Out  Colonoscopy in 2018. No further testing recommended per GI as he will  be 85 at that time.    Immunization History  Administered Date(s) Administered  . Fluad Quad(high Dose 65+) 06/03/2019, 06/08/2022  . Influenza Split 07/20/2012, 07/02/2014  . Influenza, High Dose Seasonal PF 07/09/2016, 07/11/2017, 07/16/2018, 07/13/2023  . Influenza,inj,Quad PF,6+ Mos 08/02/2013, 07/17/2015  . Influenza-Unspecified 07/24/2016, 06/17/2020  . PFIZER(Purple Top)SARS-COV-2 Vaccination 11/05/2019, 11/28/2019, 07/09/2020, 02/27/2021, 08/24/2021, 01/19/2022  . Pfizer(Comirnaty)Fall Seasonal Vaccine 12 years and older 07/13/2023  . Pneumococcal Conjugate-13 07/11/2014  . Pneumococcal Polysaccharide-23 07/17/2015  . Respiratory Syncytial Virus Vaccine,Recomb Aduvanted(Arexvy) 08/20/2022  . Zoster Recombinant(Shingrix) 04/23/2019, 07/28/2020  Prevnar 20 today.   No results found. Dr. Marchelle Gearing. Hs of aphakia. He does use eye gtts.   Dental: false teeth. No current dentist.   Alcohol: none.   Tobacco: none.quit smoking in 1999.   Exercise: walking, active with physical work.    History Patient Active Problem List   Diagnosis Date Noted  . 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 Educated about COVID-19 virus infection 05/11/2020  . Dilated cardiomyopathy (HCC) 05/11/2020  . Dysphasia 10/05/2019  . 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 Esophageal obstruction due to food impaction   . Esophageal stricture   . Constipation 11/24/2017  . Deaf 10/28/2013  .  CARDIOMYOPATHY 04/20/2009  . Essential hypertension 04/19/2009  . HIATAL HERNIA, HX OF 04/19/2009   Past Medical History:  Diagnosis Date  . Anxiety   . Aortic valve insufficiency   . Asthma   . Cardiomyopathy (HCC)   . Chest pain    per pt/ was resolved/ was [place] per pt.  . Deafness    Pt does sign language!  . Diverticulosis   . Dysphagia, unspecified(787.20)   . Esophageal spasm   . Esophageal stricture   . GERD (gastroesophageal reflux disease)    no meds  . H. pylori infection   .  Hiatal hernia   . Hypertension   . Internal hemorrhoids   . S/P colonoscopy    Past Surgical History:  Procedure Laterality Date  . BIOPSY  09/17/2019   Procedure: BIOPSY;  Surgeon: Lynann Bologna, MD;  Location[facility] END[facility]COPY;  Service: Endoscopy;;  . COLONOSCOPY    . ESOPHAGEAL DILATION  09/17/2019   Procedure: ESOPHAGEAL MALONEY DILATION;  Surgeon: Lynann Bologna, MD;  Location: [facility] ENDOSCOPY;  Service: Endoscopy;;  . ESOPHAGOGASTRODUODENOSCOPY (EGD) WITH PROPOFOL N/A 09/17/2019   Procedure: ESOPHAGOGASTRODUODENOSCOPY (EGD) WITH PROPOFOL;  Surgeon: Lynann Bologna, MD;  Location[facility] ENDOSCOPY;  Service: Endoscopy;  Laterality: N/A;  . TONSILLECTOMY     as a child  . UPPER GASTROINTESTINAL ENDOSCOPY     No Known Allergies Prior to Admission medications   Medication Sig Start Date End Date Taking? Authorizing Provider  diltiazem (DILT-XR) 180 MG 24 hr capsule TAKE 1 CAPSULE BY MOUTH EVERY DAY 09/18/22  Yes Shade Flood, MD  Fluticasone Furoate (ARNUITY ELLIPTA) 100 MCG/ACT AEPB Inhale 1 puff into the lungs daily. 11/12/23  Yes Shade Flood, MD  omeprazole (PRILOSEC) 20 MG capsule Take 1 capsule (20 mg total) by mouth 2 (two) times daily before a meal. 11/11/23  Yes Shade Flood, MD   Social History   Socioeconomic History  . Marital status: Married    Spouse name: Not on file  . Number of children: 4  . Years of education: Not on file  . Highest education level: Some college, no degree  Occupational History  . Occupation: retired    Associate Professor: [employer]  Tobacco Use  . Smoking status: Former    Current packs/day: 0.00    Types: Cigarettes    Quit date: 1987    Years since quitting: 38.1  . Smokeless tobacco: Never  Vaping Use  . Vaping status: Never Used  Substance and Sexual Activity  . Alcohol use: No    Comment: quit 1987  . Drug use: No  . Sexual activity: Not Currently  Other Topics Concern  . Not on file  Social History Narrative   Married   Works as  custodian   Education:  [employer] school   Social Drivers of Health   Financial Resource Strain: Low Risk  (09/29/2017)   Overall Financial Resource Strain (CARDIA)   . Difficulty of Paying Living Expenses: Not hard at all  Food Insecurity: No Food Insecurity (09/29/2017)   Hunger Vital Sign   . Worried About Programme researcher, broadcasting/film/video in the Last Year: Never true   . Ran Out of Food in the Last Year: Never true  Transportation Needs: No Transportation Needs (09/29/2017)   PRAPARE - Transportation   . Lack of Transportation (Medical): No   . Lack of Transportation (Non-Medical): No  Physical Activity: Inactive (09/29/2017)   Exercise Vital Sign   . Days of Exercise per Week: 0 days   .  Minutes of Exercise per Session: 0 min  Stress: No Stress Concern Present (09/29/2017)   [employer] of Occupational Health - Occupational Stress Questionnaire   . Feeling of Stress : Not at all  Social Connections: Socially Integrated (09/29/2017)   Social Connection and Isolation Panel [NHANES]   . Frequency of Communication with Friends and Family: More than three times a week   . Frequency of Social Gatherings with Friends and Family: More than three times a week   . Attends Religious Services: More than 4 times per year   . Active Member of Clubs or Organizations: Yes   . Attends Banker Meetings: More than 4 times per year   . Marital Status: Married  Catering manager Violence: Not At Risk (09/29/2017)   Humiliation, Afraid, Rape, and Kick questionnaire   . Fear of Current or Ex-Partner: No   . Emotionally Abused: No   . Physically Abused: No   . Sexually Abused: No    Review of Systems   Objective:   Vitals:   11/17/23 1611  BP: 118/60  Pulse: 81  Temp: 97.9 F (36.6 C)  TempSrc: Temporal  SpO2: 96%  Weight: 149 lb (67.6 kg)  Height: 6' 0.25" (1.835 m)   {Vitals History (Optional):23777}  Physical Exam Vitals reviewed.  Constitutional:      Appearance: He is well-developed.   HENT:     Head: Normocephalic and atraumatic.     Right Ear: External ear normal.     Left Ear: External ear normal.  Eyes:     Conjunctiva/sclera: Conjunctivae normal.     Pupils: Pupils are equal, round, and reactive to light.  Neck:     Thyroid: No thyromegaly.  Cardiovascular:     Rate and Rhythm: Normal rate and regular rhythm.     Heart sounds: Normal heart sounds.  Pulmonary:     Effort: Pulmonary effort is normal. No respiratory distress.     Breath sounds: Normal breath sounds. No wheezing.  Abdominal:     General: There is no distension.     Palpations: Abdomen is soft.     Tenderness: There is no abdominal tenderness.  Musculoskeletal:        General: No tenderness. Normal range of motion.     Cervical back: Normal range of motion and neck supple.  Lymphadenopathy:     Cervical: No cervical adenopathy.  Skin:    General: Skin is warm and dry.  Neurological:     Mental Status: He is alert and oriented to person, place, and time.     Deep Tendon Reflexes: Reflexes are normal and symmetric.  Psychiatric:        Behavior: Behavior normal.       Assessment & Plan:  Bradley Ramirez is a 83 y.o. male . Essential hypertension   No orders of the defined types were placed in this encounter.  There are no Patient Instructions on file for this visit.    Signed,   Meredith Staggers, MD [facility], [facility] 11/17/23 4:19 PM

## 2023-11-17 NOTE — Patient Instructions (Signed)
 Thank you for coming in today.  No change in meds for now.  Keep follow-up with cardiology as planned.  If any concerns on labs I will let you know.  Take care!  Preventive Care 21 Years and Older, Male Preventive care refers to lifestyle choices and visits with your health care provider that can promote health and wellness. Preventive care visits are also called wellness exams. What can I expect for my preventive care visit? Counseling During your preventive care visit, your health care provider may ask about your: Medical history, including: Past medical problems. Family medical history. History of falls. Current health, including: Emotional well-being. Home life and relationship well-being. Sexual activity. Memory and ability to understand (cognition). Lifestyle, including: Alcohol, nicotine or tobacco, and drug use. Access to firearms. Diet, exercise, and sleep habits. Work and work Astronomer. Sunscreen use. Safety issues such as seatbelt and bike helmet use. Physical exam Your health care provider will check your: Height and weight. These may be used to calculate your BMI (body mass index). BMI is a measurement that tells if you are at a healthy weight. Waist circumference. This measures the distance around your waistline. This measurement also tells if you are at a healthy weight and may help predict your risk of certain diseases, such as type 2 diabetes and high blood pressure. Heart rate and blood pressure. Body temperature. Skin for abnormal spots. What immunizations do I need?  Vaccines are usually given at various ages, according to a schedule. Your health care provider will recommend vaccines for you based on your age, medical history, and lifestyle or other factors, such as travel or where you work. What tests do I need? Screening Your health care provider may recommend screening tests for certain conditions. This may include: Lipid and cholesterol levels. Diabetes  screening. This is done by checking your blood sugar (glucose) after you have not eaten for a while (fasting). Hepatitis C test. Hepatitis B test. HIV (human immunodeficiency virus) test. STI (sexually transmitted infection) testing, if you are at risk. Lung cancer screening. Colorectal cancer screening. Prostate cancer screening. Abdominal aortic aneurysm (AAA) screening. You may need this if you are a current or former smoker. Talk with your health care provider about your test results, treatment options, and if necessary, the need for more tests. Follow these instructions at home: Eating and drinking  Eat a diet that includes fresh fruits and vegetables, whole grains, lean protein, and low-fat dairy products. Limit your intake of foods with high amounts of sugar, saturated fats, and salt. Take vitamin and mineral supplements as recommended by your health care provider. Do not drink alcohol if your health care provider tells you not to drink. If you drink alcohol: Limit how much you have to 0-2 drinks a day. Know how much alcohol is in your drink. In the [place] one drink equals one 12 oz bottle of beer (355 mL), one 5 oz glass of wine (148 mL), or one 1 oz glass of hard liquor (44 mL). Lifestyle Brush your teeth every morning and night with fluoride toothpaste. Floss one time each day. Exercise for at least 30 minutes 5 or more days each week. Do not use any products that contain nicotine or tobacco. These products include cigarettes, chewing tobacco, and vaping devices, such as e-cigarettes. If you need help quitting, ask your health care provider. Do not use drugs. If you are sexually active, practice safe sex. Use a condom or other form of protection to prevent STIs. Take aspirin  only as told by your health care provider. Make sure that you understand how much to take and what form to take. Work with your health care provider to find out whether it is safe and beneficial for you to take  aspirin daily. Ask your health care provider if you need to take a cholesterol-lowering medicine (statin). Find healthy ways to manage stress, such as: Meditation, yoga, or listening to music. Journaling. Talking to a trusted person. Spending time with friends and family. Safety Always wear your seat belt while driving or riding in a vehicle. Do not drive: If you have been drinking alcohol. Do not ride with someone who has been drinking. When you are tired or distracted. While texting. If you have been using any mind-altering substances or drugs. Wear a helmet and other protective equipment during sports activities. If you have firearms in your house, make sure you follow all gun safety procedures. Minimize exposure to UV radiation to reduce your risk of skin cancer. What's next? Visit your health care provider once a year for an annual wellness visit. Ask your health care provider how often you should have your eyes and teeth checked. Stay up to date on all vaccines. This information is not intended to replace advice given to you by your health care provider. Make sure you discuss any questions you have with your health care provider. Document Revised: 03/07/2021 Document Reviewed: 03/07/2021 Elsevier Patient Education  2024 [employer].

## 2023-11-18 ENCOUNTER — Encounter: Payer: Self-pay | Admitting: Family Medicine

## 2023-11-18 LAB — BASIC METABOLIC PANEL
BUN: 33 mg/dL — ABNORMAL HIGH (ref 6–23)
CO2: 27 meq/L (ref 19–32)
Calcium: 9.1 mg/dL (ref 8.4–10.5)
Chloride: 108 meq/L (ref 96–112)
Creatinine, Ser: 1.56 mg/dL — ABNORMAL HIGH (ref 0.40–1.50)
GFR: 40.7 mL/min — ABNORMAL LOW (ref >60.00)
Glucose, Bld: 96 mg/dL (ref 70–99)
Potassium: 4.2 meq/L (ref 3.5–5.1)
Sodium: 143 meq/L (ref 135–145)

## 2023-11-20 ENCOUNTER — Encounter: Payer: Self-pay | Admitting: Family Medicine

## 2023-12-03 ENCOUNTER — Other Ambulatory Visit (INDEPENDENT_AMBULATORY_CARE_PROVIDER_SITE_OTHER): Payer: Medicare PPO

## 2023-12-03 DIAGNOSIS — R7989 Other specified abnormal findings of blood chemistry: Secondary | ICD-10-CM

## 2023-12-03 LAB — BASIC METABOLIC PANEL
BUN: 27 mg/dL — ABNORMAL HIGH (ref 6–23)
CO2: 27 meq/L (ref 19–32)
Calcium: 8.8 mg/dL (ref 8.4–10.5)
Chloride: 104 meq/L (ref 96–112)
Creatinine, Ser: 1.56 mg/dL — ABNORMAL HIGH (ref 0.40–1.50)
GFR: 40.69 mL/min — ABNORMAL LOW (ref >60.00)
Glucose, Bld: 113 mg/dL — ABNORMAL HIGH (ref 70–99)
Potassium: 4.1 meq/L (ref 3.5–5.1)
Sodium: 137 meq/L (ref 135–145)

## 2023-12-04 ENCOUNTER — Ambulatory Visit: Payer: Medicare PPO | Admitting: Gastroenterology

## 2023-12-06 ENCOUNTER — Encounter: Payer: Self-pay | Admitting: Family Medicine

## 2023-12-12 ENCOUNTER — Telehealth: Payer: Self-pay

## 2023-12-12 NOTE — Telephone Encounter (Signed)
 Repeat electrolytes were similar.  Make sure to drink sufficient water during the day, and follow-up with me in the next month for repeat testing and discussion of plan, but no new meds for now.  Let me know if there are questions.   Dr. Neva Seat

## 2023-12-12 NOTE — Telephone Encounter (Signed)
-----   Message from Shade Flood sent at 12/12/2023  1:49 PM EDT ----- Results sent by MyChart, but appears patient has not yet reviewed those results.  Please call and make sure they have either seen note or discuss result note.  Thanks.

## 2023-12-15 NOTE — Telephone Encounter (Signed)
 Called patient to discuss lab work, no answer, left a message for the patient to call back to discuss or review by MyChart if that is prefered.

## 2023-12-22 NOTE — Progress Notes (Unsigned)
 Cardiology Office Note   Date:  12/25/2023   ID:  Bradley Ramirez, DOB 1941-07-11, MRN [id number]  PCP:  Shade Flood, MD  Cardiologist:   Rollene Rotunda, MD Referring:  Shade Flood, MD  No chief complaint on file.     History of Present Illness: Bradley Ramirez is a 83 y.o. 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  His ejection fraction was 35% at one time.  His EF was 50% with mild AI in 2019.  He was again 50 to 55% in 2022.  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  He walks around a lot and goes up and down stairs. The patient denies any new symptoms such as chest discomfort, neck or arm discomfort. There has been no new shortness of breath, PND or orthopnea. There have been no reported palpitations, presyncope or syncope.   The exam and history were completed using an in person sign language interpreter.   Past Medical History:  Diagnosis Date   Anxiety    Aortic valve insufficiency    Asthma    Cardiomyopathy (HCC)    Chest pain    per pt/ was resolved/ was [place] per pt.   Deafness    Pt does sign language!   Diverticulosis    Dysphagia, unspecified(787.20)    Esophageal spasm    Esophageal stricture    GERD (gastroesophageal reflux disease)    no meds   H. pylori infection    Hiatal hernia    Hypertension    Internal hemorrhoids    S/P colonoscopy     Past Surgical History:  Procedure Laterality Date   BIOPSY  09/17/2019   Procedure: BIOPSY;  Surgeon: Lynann Bologna, MD;  Location: [facility] ENDOSCOPY;  Service: Endoscopy;;   COLONOSCOPY     ESOPHAGEAL DILATION  09/17/2019   Procedure: ESOPHAGEAL MALONEY DILATION;  Surgeon: Lynann Bologna, MD;  Location: [facility] ENDOSCOPY;  Service: Endoscopy;;   ESOPHAGOGASTRODUODENOSCOPY (EGD) WITH PROPOFOL N/A 09/17/2019   Procedure: ESOPHAGOGASTRODUODENOSCOPY (EGD) WITH PROPOFOL;  Surgeon: Lynann Bologna, MD;  Location: [facility]  ENDOSCOPY;  Service: Endoscopy;  Laterality: N/A;   TONSILLECTOMY     as a child   UPPER GASTROINTESTINAL ENDOSCOPY       Current Outpatient Medications  Medication Sig Dispense Refill   diltiazem (DILT-XR) 180 MG 24 hr capsule TAKE 1 CAPSULE BY MOUTH EVERY DAY 90 capsule 3   Fluticasone Furoate (ARNUITY ELLIPTA) 100 MCG/ACT AEPB Inhale 1 puff into the lungs daily. 30 each 5   omeprazole (PRILOSEC) 20 MG capsule Take 1 capsule (20 mg total) by mouth 2 (two) times daily before a meal. 60 capsule 0   No current facility-administered medications for this visit.    Allergies:   Patient has no known allergies.    Social History:  The patient  reports that he quit smoking about 38 years ago. His smoking use included cigarettes. He has never used smokeless tobacco. He reports that he does not drink alcohol and does not use drugs.   Family History:  The patient's family history includes Diabetes in his maternal grandfather, maternal grandmother, and maternal uncle; Uterine cancer in his maternal aunt and mother.    ROS:  Please see the history of present illness.   Otherwise, review of systems are positive for none.   All other systems are reviewed and negative.    PHYSICAL EXAM: VS:  BP 130/70 (BP Location: Left Arm, Patient Position: Sitting, Cuff Size: Normal)   Pulse 65   Ht 6' (1.829 m)   Wt 149 lb (67.6 kg)   BMI 20.21 kg/m  , BMI Body mass index is 20.21 kg/m. GENERAL:  Well appearing HEENT:  Pupils equal round and reactive, fundi not visualized, oral mucosa unremarkable NECK:  No jugular venous distention, waveform within normal limits, carotid upstroke brisk and symmetric, no bruits, no thyromegaly LYMPHATICS:  No cervical, inguinal adenopathy LUNGS:  Clear to auscultation bilaterally BACK:  No CVA tenderness CHEST:  Unremarkable HEART:  PMI not displaced or sustained,S1 and S2 within normal limits, no S3, no S4, no clicks, no rubs, 2 out of 6 diastolic murmur heard at the  third left intercostal space and continuing long into systole, no diastolic murmurs ABD:  Flat, positive bowel sounds normal in frequency in pitch, no bruits, no rebound, no guarding, no midline pulsatile mass, no hepatomegaly, no splenomegaly EXT:  2 plus pulses throughout, no edema, no cyanosis no clubbing SKIN:  No rashes no nodules NEURO:  Cranial nerves II through XII grossly intact, motor grossly intact throughout [facility]:  Cognitively intact, oriented to person place and time    EKG:  EKG Interpretation Date/Time:  Thursday December 25 2023 13:02:36 EDT Ventricular Rate:  65 PR Interval:  202 QRS Duration:  84 QT Interval:  394 QTC Calculation: 409 R Axis:   5  Text Interpretation: Sinus rhythm with Premature atrial complexes When compared with ECG of 25-Dec-2023 13:02, No significant change was found Confirmed by Rollene Rotunda ([id number]) on 12/25/2023 1:04:48 PM     Recent Labs: 12/03/2023: BUN 27; Creatinine, Ser 1.56; Potassium 4.1; Sodium 137    Lipid Panel    Component Value Date/Time   CHOL 186 09/29/2017 1030   TRIG 75 09/29/2017 1030   HDL 57 09/29/2017 1030   CHOLHDL 3.3 09/29/2017 1030   CHOLHDL 2.9 07/17/2015 1415   VLDL 13 07/17/2015 1415   LDLCALC 114 (H) 09/29/2017 1030      Wt Readings from Last 3 Encounters:  12/25/23 149 lb (67.6 kg)  11/17/23 149 lb (67.6 kg)  09/18/22 154 lb (69.9 kg)      Other studies Reviewed: Additional studies/ records that were reviewed today include: None. Review of the above records demonstrates:  Please see elsewhere in the note.     ASSESSMENT AND PLAN:  CARDIOMYOPATHY -  His ejection fraction was low normal in 2022.  I will repeat an echocardiogram.  He will remain on the meds as listed.  HYPERTENSION, UNSPECIFIED -  The blood pressure is well-controlled.  Continue meds as listed.  CKD:   He has some mild renal insufficiency with creatinine 1.56 is being followed by his primary provider.  AI - This was mild in  2022.  I will assess this with an echocardiogram as above.  I suspect we will be able to follow this clinically.  Current medicines are reviewed at length with the patient today.  The patient does not have concerns regarding medicines.  The following changes have been made:  no change  Labs/ tests ordered today include:   Orders Placed This Encounter  Procedures   EKG 12-Lead   EKG 12-Lead     Disposition:   FU with with me in two years or sooner if he develops any symptoms such as shortness of breath.   Signed, Rollene Rotunda, MD  12/25/2023 1:28 PM    [facility]

## 2023-12-25 ENCOUNTER — Encounter: Payer: Self-pay | Admitting: Cardiology

## 2023-12-25 ENCOUNTER — Ambulatory Visit: Payer: Medicare PPO | Attending: Cardiology | Admitting: Cardiology

## 2023-12-25 VITALS — BP 130/70 | HR 65 | Ht 72.0 in | Wt 149.0 lb

## 2023-12-25 DIAGNOSIS — I420 Dilated cardiomyopathy: Secondary | ICD-10-CM

## 2023-12-25 DIAGNOSIS — I351 Nonrheumatic aortic (valve) insufficiency: Secondary | ICD-10-CM | POA: Diagnosis not present

## 2023-12-25 NOTE — Patient Instructions (Signed)
 Medication Instructions:  No changes.  *If you need a refill on your cardiac medications before your next appointment, please call your pharmacy*  Testing/Procedures: Your physician has requested that you have an echocardiogram. Echocardiography is a painless test that uses sound waves to create images of your heart. It provides your doctor with information about the size and shape of your heart and how well your heart's chambers and valves are working. This procedure takes approximately one hour. There are no restrictions for this procedure. Please do NOT wear cologne, perfume, aftershave, or lotions (deodorant is allowed). Please arrive 15 minutes prior to your appointment time.  Please note: We ask at that you not bring children with you during ultrasound (echo/ vascular) testing. Due to room size and safety concerns, children are not allowed in the ultrasound rooms during exams. Our front office staff cannot provide observation of children in our lobby area while testing is being conducted. An adult accompanying a patient to their appointment will only be allowed in the ultrasound room at the discretion of the ultrasound technician under special circumstances. We apologize for any inconvenience.   Follow-Up: At [facility], you and your health needs are our priority.  As part of our continuing mission to provide you with exceptional heart care, our providers are all part of one team.  This team includes your primary Cardiologist (physician) and Advanced Practice Providers or APPs (Physician Assistants and Nurse Practitioners) who all work together to provide you with the care you need, when you need it.  Your next appointment:   2 year(s)  Provider:   Rollene Rotunda, MD     We recommend signing up for the patient portal called "MyChart".  Sign up information is provided on this After Visit Summary.  MyChart is used to connect with patients for Virtual Visits (Telemedicine).   Patients are able to view lab/test results, encounter notes, upcoming appointments, etc.  Non-urgent messages can be sent to your provider as well.   To learn more about what you can do with MyChart, go to [email or web address].   Other Instructions       1st Floor: - Lobby - Registration  - Pharmacy  - Lab - Cafe  2nd Floor: - PV Lab - Diagnostic Testing (echo, CT, nuclear med)  3rd Floor: - Vacant  4th Floor: - TCTS (cardiothoracic surgery) - AFib Clinic - Structural Heart Clinic - Vascular Surgery  - Vascular Ultrasound  5th Floor: - HeartCare Cardiology (general and EP) - Clinical Pharmacy for coumadin, hypertension, lipid, weight-loss medications, and med management appointments    Valet parking services will be available as well.

## 2024-01-12 ENCOUNTER — Ambulatory Visit: Admitting: Family Medicine

## 2024-01-12 ENCOUNTER — Encounter: Payer: Self-pay | Admitting: Family Medicine

## 2024-01-12 ENCOUNTER — Other Ambulatory Visit

## 2024-01-12 VITALS — BP 132/76 | HR 77 | Temp 98.7°F | Ht 72.0 in | Wt 152.6 lb

## 2024-01-12 DIAGNOSIS — N1832 Chronic kidney disease, stage 3b: Secondary | ICD-10-CM | POA: Diagnosis not present

## 2024-01-12 DIAGNOSIS — R739 Hyperglycemia, unspecified: Secondary | ICD-10-CM | POA: Diagnosis not present

## 2024-01-12 NOTE — Patient Instructions (Addendum)
 Thank you for coming in today.  I will recheck your labs, but I think the kidney test is similar to what you have had in the past. Aim for 48-64 ounces of water per day. I will also recheck blood sugar on test today.   Take care.

## 2024-01-12 NOTE — Progress Notes (Signed)
 Subjective:  Patient ID: Bradley Ramirez, male    DOB: [birth year]  Age: 83 y.o. MRN: [id number]  CC:  Chief Complaint  Patient presents with   Medical Management of Chronic Issues    Patient due for recheck after labs, was under the impression this was for labs only,     HPI Bradley Ramirez presents for review of labs. Presents with spouse. ASL interpreter with video interpreter. #[id number].   Elevated creatinine: 1.56 on February 24 and March 12.  Previous 1.47.  Most recent EGFR 40.  Of note his creatinine was 1.56, 1.65 in 2022 with repeat testing 1.43 at that time.  Sufficient hydration discussed with recent elevated readings.  Recent visit for his physical February 24 with normal blood pressure at that time of 118/60, history of stage IIIb CKD, dilated cardiomyopathy followed by cardiology treated with diltiazem  XR 180 mg daily. Not taking any nsaids.  Drinking water - throughout the day - avoiding soda. Discussed 48-64 oz water per day.   Hyperglycemia Glucose 113 on March 12 labs.  No recent A1c. Not fasting on last labs.    Lab Results  Component Value Date   HGBA1C  03/23/2009    5.9 (NOTE) The ADA recommends the following therapeutic goal for glycemic control related to Hgb A1c measurement: Goal of therapy: <6.5 Hgb A1c  Reference: American Diabetes Association: Clinical Practice Recommendations 2010, Diabetes Care, 2010, 33: (Suppl  1).     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   Deafness    Pt does sign language!   Diverticulosis    Dysphagia, unspecified(787.20)    Esophageal spasm    Esophageal stricture    GERD (gastroesophageal reflux disease)    no meds   H. pylori infection    Hiatal hernia    Hypertension    Internal hemorrhoids    S/P colonoscopy    Past Surgical History:  Procedure Laterality Date   BIOPSY  09/17/2019   Procedure: BIOPSY;  Surgeon: Lajuan Pila, MD;  Location: [facility] ENDOSCOPY;  Service: Endoscopy;;   COLONOSCOPY     ESOPHAGEAL DILATION  09/17/2019   Procedure: ESOPHAGEAL MALONEY DILATION;  Surgeon: Lajuan Pila, MD;  Location: [facility] ENDOSCOPY;  Service: Endoscopy;;   ESOPHAGOGASTRODUODENOSCOPY (EGD) WITH PROPOFOL  N/A 09/17/2019   Procedure: ESOPHAGOGASTRODUODENOSCOPY (EGD) WITH PROPOFOL ;  Surgeon: Lajuan Pila, MD;  Location: [facility] ENDOSCOPY;  Service: Endoscopy;  Laterality: N/A;   TONSILLECTOMY     as a child   UPPER GASTROINTESTINAL ENDOSCOPY     No Known Allergies Prior to Admission medications   Medication Sig Start Date End Date Taking? Authorizing Provider  diltiazem  (DILT-XR) 180 MG 24 hr capsule TAKE 1 CAPSULE BY MOUTH EVERY DAY 11/17/23  Yes Benjiman Bras, MD  Fluticasone  Furoate (ARNUITY ELLIPTA ) 100 MCG/ACT AEPB Inhale 1 puff into the lungs daily. 11/12/23  Yes Benjiman Bras, MD  omeprazole  (PRILOSEC) 20 MG capsule Take 1 capsule (20 mg total) by mouth 2 (two) times daily before a meal. 11/11/23  Yes Benjiman Bras, MD   Social History   Socioeconomic History   Marital status: Married    Spouse name: Not on file   Number of children: 4   Years of education: Not on file   Highest education level: Some college, no degree  Occupational History   Occupation: retired    Associate Professor: [employer]  Tobacco Use   Smoking status: Former    Current packs/day: 0.00    Types: Cigarettes    Quit date:  1987    Years since quitting: 38.3   Smokeless tobacco: Never  Vaping Use   Vaping status: Never Used  Substance and Sexual Activity   Alcohol use: No    Comment: quit 1987   Drug use: No   Sexual activity: Not Currently  Other Topics Concern   Not on file  Social History Narrative   Married   Works as custodian   Education:  [employer] school   Social Drivers of Health   Financial Resource Strain: Low Risk  (09/29/2017)   Overall Financial Resource Strain (CARDIA)    Difficulty of Paying Living Expenses: Not hard at all  Food Insecurity: No Food Insecurity (09/29/2017)   Hunger Vital Sign    Worried About Running Out of Food in the Last Year: Never true    Ran Out of Food in the Last Year: Never true  Transportation Needs: No Transportation Needs (09/29/2017)   PRAPARE - Administrator, Civil Service (Medical): No    Lack of Transportation (Non-Medical): No  Physical Activity: Inactive (09/29/2017)   Exercise Vital Sign    Days of Exercise per Week: 0 days    Minutes of Exercise per Session: 0 min  Stress: No Stress Concern Present (09/29/2017)   [employer] of Occupational Health - Occupational Stress Questionnaire    Feeling of Stress : Not at all  Social Connections: Socially Integrated (09/29/2017)   Social Connection and Isolation Panel [NHANES]    Frequency of Communication with Friends and Family: More than three times a week    Frequency of Social Gatherings with Friends and Family: More than three times a week    Attends Religious Services: More than 4 times per year    Active Member of [employer] or Organizations: Yes    Attends Banker Meetings: More than 4 times per year    Marital Status: Married  Catering manager Violence: Not At Risk (09/29/2017)   Humiliation, Afraid, Rape, and Kick questionnaire    Fear of Current or Ex-Partner: No    Emotionally Abused: No    Physically Abused: No    Sexually Abused: No    Review of Systems   Objective:    Vitals:   01/12/24 1318  BP: 132/76  Pulse: 77  Temp: 98.7 F (37.1 C)  TempSrc: Temporal  SpO2: 99%  Weight: 152 lb 9.6 oz (69.2 kg)  Height: 6' (1.829 m)     Physical Exam Vitals reviewed.  Constitutional:      Appearance: He is well-developed.  HENT:     Head: Normocephalic and atraumatic.  Neck:     Vascular: No carotid bruit or JVD.  Cardiovascular:     Rate and Rhythm: Normal rate and regular rhythm.     Heart sounds: Normal heart sounds. No murmur heard. Pulmonary:  Effort: Pulmonary effort is normal.     Breath sounds: Normal breath sounds. No rales.  Musculoskeletal:     Right lower leg: No edema.     Left lower leg: No edema.  Skin:    General: Skin is warm and dry.  Neurological:     Mental Status: He is alert and oriented to person, place, and time.  Psychiatric:        Mood and Affect: Mood normal.        Assessment & Plan:  Henery Betzold is a 83 y.o. male . Stage 3b chronic kidney disease (HCC) - Plan: Basic metabolic panel with GFR  - Looking back similar readings were noted few years ago.  Maintenance of hydration discussed, avoid NSAIDs, repeat labs to monitor trend and if stable recheck next year as planned.  Hyperglycemia - Plan: Hemoglobin A1c  - Possibly not fasting for most recent labs, check A1c.  No orders of the defined types were placed in this encounter.  Patient Instructions  Thank you for coming in today.  I will recheck your labs, but I think the kidney test is similar to what you have had in the past. Aim for 48-64 ounces of water per day. I will also recheck blood sugar on test today.   Take care.     Signed,   Caro Christmas, MD [facility], [facility] 01/12/24 2:04 PM

## 2024-01-13 ENCOUNTER — Encounter: Payer: Self-pay | Admitting: Family Medicine

## 2024-01-13 LAB — BASIC METABOLIC PANEL WITH GFR
BUN: 24 mg/dL — ABNORMAL HIGH (ref 6–23)
CO2: 26 meq/L (ref 19–32)
Calcium: 8.8 mg/dL (ref 8.4–10.5)
Chloride: 106 meq/L (ref 96–112)
Creatinine, Ser: 1.58 mg/dL — ABNORMAL HIGH (ref 0.40–1.50)
GFR: 40.04 mL/min — ABNORMAL LOW (ref >60.00)
Glucose, Bld: 82 mg/dL (ref 70–99)
Potassium: 4.2 meq/L (ref 3.5–5.1)
Sodium: 139 meq/L (ref 135–145)

## 2024-01-13 LAB — HEMOGLOBIN A1C: Hgb A1c MFr Bld: 6.3 % (ref 4.6–6.5)

## 2024-01-26 ENCOUNTER — Ambulatory Visit (HOSPITAL_COMMUNITY): Attending: Cardiovascular Disease

## 2024-01-26 DIAGNOSIS — I351 Nonrheumatic aortic (valve) insufficiency: Secondary | ICD-10-CM

## 2024-01-26 LAB — ECHOCARDIOGRAM COMPLETE
Area-P 1/2: 1.89 cm2
S' Lateral: 3.23 cm

## 2024-01-30 ENCOUNTER — Telehealth: Payer: Self-pay | Admitting: Cardiology

## 2024-01-30 NOTE — Telephone Encounter (Signed)
 Patient and patient's wife is returning phone call in regard to Echo results.

## 2024-01-30 NOTE — Telephone Encounter (Signed)
 Spoke with Pt through interpreter. Results given and no questions at this time.

## 2024-02-05 ENCOUNTER — Ambulatory Visit (HOSPITAL_BASED_OUTPATIENT_CLINIC_OR_DEPARTMENT_OTHER): Payer: Self-pay

## 2024-05-17 ENCOUNTER — Other Ambulatory Visit: Payer: Self-pay | Admitting: Family Medicine

## 2024-05-17 DIAGNOSIS — J4520 Mild intermittent asthma, uncomplicated: Secondary | ICD-10-CM

## 2024-07-26 DIAGNOSIS — I429 Cardiomyopathy, unspecified: Secondary | ICD-10-CM | POA: Diagnosis not present

## 2024-07-26 DIAGNOSIS — N1832 Chronic kidney disease, stage 3b: Secondary | ICD-10-CM | POA: Diagnosis not present

## 2024-07-26 DIAGNOSIS — J449 Chronic obstructive pulmonary disease, unspecified: Secondary | ICD-10-CM | POA: Diagnosis not present

## 2024-07-26 DIAGNOSIS — I129 Hypertensive chronic kidney disease with stage 1 through stage 4 chronic kidney disease, or unspecified chronic kidney disease: Secondary | ICD-10-CM | POA: Diagnosis not present

## 2024-07-26 DIAGNOSIS — K219 Gastro-esophageal reflux disease without esophagitis: Secondary | ICD-10-CM | POA: Diagnosis not present

## 2024-07-26 DIAGNOSIS — Z87891 Personal history of nicotine dependence: Secondary | ICD-10-CM | POA: Diagnosis not present

## 2024-08-23 ENCOUNTER — Other Ambulatory Visit: Payer: Self-pay | Admitting: Family Medicine

## 2024-08-23 DIAGNOSIS — I10 Essential (primary) hypertension: Secondary | ICD-10-CM

## 2024-11-17 ENCOUNTER — Ambulatory Visit

## 2024-11-17 ENCOUNTER — Encounter: Payer: Medicare PPO | Admitting: Family Medicine
